# Patient Record
Sex: Female | Born: 1945
Health system: Southern US, Community
[De-identification: ages and names within clinical notes are randomized; demographics above are authoritative.]

## PROBLEM LIST (undated history)

## (undated) DIAGNOSIS — C50919 Malignant neoplasm of unspecified site of unspecified female breast: Secondary | ICD-10-CM

## (undated) DIAGNOSIS — I10 Essential (primary) hypertension: Secondary | ICD-10-CM

## (undated) DIAGNOSIS — C801 Malignant (primary) neoplasm, unspecified: Secondary | ICD-10-CM

## (undated) HISTORY — DX: Malignant neoplasm of unspecified site of unspecified female breast: C50.919

---

## 2004-09-01 HISTORY — PX: ABDOMINAL HYSTERECTOMY: SHX81

## 2015-09-07 ENCOUNTER — Encounter (HOSPITAL_COMMUNITY): Payer: Self-pay | Admitting: Family Medicine

## 2015-09-07 ENCOUNTER — Emergency Department (HOSPITAL_COMMUNITY)
Admission: EM | Admit: 2015-09-07 | Discharge: 2015-09-07 | Disposition: A | Discharge status: Home / Self Care | Payer: Managed Care, Other (non HMO) | Attending: Emergency Medicine | Admitting: Emergency Medicine

## 2015-09-07 DIAGNOSIS — Z87891 Personal history of nicotine dependence: Secondary | ICD-10-CM | POA: Diagnosis not present

## 2015-09-07 DIAGNOSIS — C7932 Secondary malignant neoplasm of cerebral meninges: Secondary | ICD-10-CM | POA: Diagnosis not present

## 2015-09-07 DIAGNOSIS — I10 Essential (primary) hypertension: Secondary | ICD-10-CM | POA: Diagnosis not present

## 2015-09-07 DIAGNOSIS — R739 Hyperglycemia, unspecified: Secondary | ICD-10-CM | POA: Diagnosis not present

## 2015-09-07 DIAGNOSIS — C50919 Malignant neoplasm of unspecified site of unspecified female breast: Secondary | ICD-10-CM | POA: Diagnosis not present

## 2015-09-07 DIAGNOSIS — Z7952 Long term (current) use of systemic steroids: Secondary | ICD-10-CM | POA: Insufficient documentation

## 2015-09-07 DIAGNOSIS — C787 Secondary malignant neoplasm of liver and intrahepatic bile duct: Secondary | ICD-10-CM | POA: Diagnosis not present

## 2015-09-07 DIAGNOSIS — Z79899 Other long term (current) drug therapy: Secondary | ICD-10-CM | POA: Diagnosis not present

## 2015-09-07 DIAGNOSIS — H538 Other visual disturbances: Secondary | ICD-10-CM | POA: Diagnosis not present

## 2015-09-07 HISTORY — DX: Malignant (primary) neoplasm, unspecified: C80.1

## 2015-09-07 HISTORY — DX: Essential (primary) hypertension: I10

## 2015-09-07 LAB — BASIC METABOLIC PANEL
Anion gap: 13 (ref 5–15)
BUN: 27 mg/dL — ABNORMAL HIGH (ref 6–20)
CO2: 26 mmol/L (ref 22–32)
Calcium: 9.6 mg/dL (ref 8.9–10.3)
Chloride: 101 mmol/L (ref 101–111)
Creatinine, Ser: 0.81 mg/dL (ref 0.44–1.00)
GFR calc non Af Amer: >60 mL/min (ref >60)
Glucose, Bld: 292 mg/dL — ABNORMAL HIGH (ref 65–99)
Potassium: 4.6 mmol/L (ref 3.5–5.1)
SODIUM: 140 mmol/L (ref 135–145)

## 2015-09-07 LAB — CBC
HCT: 31 % — ABNORMAL LOW (ref 36.0–46.0)
HEMOGLOBIN: 9.3 g/dL — AB (ref 12.0–15.0)
MCH: 28.6 pg (ref 26.0–34.0)
MCHC: 30 g/dL (ref 30.0–36.0)
MCV: 95.4 fL (ref 78.0–100.0)
Platelets: 194 10*3/uL (ref 150–400)
RBC: 3.25 MIL/uL — AB (ref 3.87–5.11)
RDW: 21.1 % — ABNORMAL HIGH (ref 11.5–15.5)
WBC: 8.6 10*3/uL (ref 4.0–10.5)

## 2015-09-07 LAB — CBG MONITORING, ED: GLUCOSE-CAPILLARY: 235 mg/dL — AB (ref 65–99)

## 2015-09-07 NOTE — ED Notes (Signed)
Pt here for increasead blood sugar. sts her doctor took blood work on the 4th and called her due to blood sugar being over 400. sts she is currently on steroids, and chemo for breast cancer. Pt recently moved from [place] and has no doctor here.

## 2015-09-07 NOTE — Discharge Instructions (Signed)
Referral information to hematology oncology at [facility] provided.: Monday for follow-up. Continue your current medications. Try to decrease the amount of sugar in your diet. Return for any new or worse symptoms.

## 2015-09-07 NOTE — ED Provider Notes (Addendum)
CSN: [id number]     Arrival date & time 09/07/15  1638 History   First MD Initiated Contact with Patient 09/07/15 1921     Chief Complaint  Patient presents with  . Hyperglycemia     (Consider location/radiation/quality/duration/timing/severity/associated sxs/prior Treatment) Patient is a 70 y.o. female presenting with hyperglycemia. The history is provided by the patient.  Hyperglycemia Associated symptoms: no abdominal pain, no chest pain, no confusion, no dysuria, no fever, no shortness of breath and no weakness    Patient just moved into the area today. Has been followed up in [place] currently undergoing chemotherapy next round is in February. Patient known to have metastatic breast cancer involving the meninges as well as the liver. Patient had blood work done in [place] on January 4 which results came in yesterday patient was notified by her doctor that her blood sugar was over 400. She was told to find medical treatment for this. Patient on December 28 was started on dexamethasone 4 mg twice a day for worsening vision in the right eye. Symptoms in that area apparently were not new just had worsened. No real change on the dexamethasone. Patient not worse. Patient without a past history of diabetes. Patient without any other complaints. Patient was not given a direct referral to anybody in the area for hematology oncology. Past Medical History  Diagnosis Date  . Cancer ([place])   . Hypertension    History reviewed. No pertinent past surgical history. History reviewed. No pertinent family history. Social History  Substance Use Topics  . Smoking status: Former Smoker    Types: Cigarettes  . Smokeless tobacco: None  . Alcohol Use: Yes   OB History    No data available     Review of Systems  Constitutional: Negative for fever.  HENT: Negative for congestion.   Eyes: Positive for visual disturbance.  Respiratory: Negative for shortness of breath.   Cardiovascular: Negative for  chest pain.  Gastrointestinal: Negative for abdominal pain.  Genitourinary: Negative for dysuria.  Musculoskeletal: Negative for neck pain.  Skin: Negative for rash.  Neurological: Negative for weakness and headaches.  Hematological: Does not bruise/bleed easily.  Psychiatric/Behavioral: Negative for confusion.      Allergies  Review of patient's allergies indicates no known allergies.  Home Medications   Prior to Admission medications   Medication Sig Start Date End Date Taking? Authorizing Provider  amLODipine (NORVASC) 10 MG tablet Take 10 mg by mouth daily.   Yes Historical Provider, MD  benazepril (LOTENSIN) 20 MG tablet Take 20 mg by mouth daily.   Yes Historical Provider, MD  dexamethasone (DECADRON) 4 MG tablet Take 4 mg by mouth 2 (two) times daily with a meal.   Yes Historical Provider, MD  ondansetron (ZOFRAN) 4 MG tablet Take 4 mg by mouth every 8 (eight) hours as needed for nausea or vomiting.   Yes Historical Provider, MD  OVER THE COUNTER MEDICATION Take 2 capsules by mouth 2 (two) times daily. Medication: Circulation and vein support   Yes Historical Provider, MD  potassium chloride (K-DUR,KLOR-CON) 10 MEQ tablet Take 10 mEq by mouth daily.   Yes Historical Provider, MD  valsartan-hydrochlorothiazide (DIOVAN-HCT) 160-12.5 MG tablet Take 1 tablet by mouth daily.   Yes Historical Provider, MD   BP 117/76 mmHg  Pulse 80  Resp 13  SpO2 100% Physical Exam  Constitutional: She is oriented to person, place, and time. She appears well-developed and well-nourished. No distress.  HENT:  Head: Normocephalic and atraumatic.  Mouth/Throat: Oropharynx  is clear and moist.  Eyes: Conjunctivae and EOM are normal.  Decreased vision in right eye  Neck: Normal range of motion. Neck supple.  Cardiovascular: Normal rate, regular rhythm and normal heart sounds.   No murmur heard. Pulmonary/Chest: Effort normal and breath sounds normal. No respiratory distress.  Abdominal: Soft.  Bowel sounds are normal. There is no tenderness.  Neurological: She is alert and oriented to person, place, and time. A cranial nerve deficit is present. She exhibits normal muscle tone. Coordination normal.  Decreased vision in right eye  Skin: Skin is warm.  Nursing note and vitals reviewed.   ED Course  Procedures (including critical care time) Labs Review Labs Reviewed  BASIC METABOLIC PANEL - Abnormal; Notable for the following:    Glucose, Bld 292 (*)    BUN 27 (*)    All other components within normal limits  CBC - Abnormal; Notable for the following:    RBC 3.25 (*)    Hemoglobin 9.3 (*)    HCT 31.0 (*)    RDW 21.1 (*)    All other components within normal limits  CBG MONITORING, ED - Abnormal; Notable for the following:    Glucose-Capillary 235 (*)    All other components within normal limits  URINALYSIS, ROUTINE W REFLEX MICROSCOPIC (NOT AT [facility])   Results for orders placed or performed during the hospital encounter of [id number]  Basic metabolic panel  Result Value Ref Range   Sodium 140 135 - 145 mmol/L   Potassium 4.6 3.5 - 5.1 mmol/L   Chloride 101 101 - 111 mmol/L   CO2 26 22 - 32 mmol/L   Glucose, Bld 292 (H) 65 - 99 mg/dL   BUN 27 (H) 6 - 20 mg/dL   Creatinine, Ser 0.81 0.44 - 1.00 mg/dL   Calcium 9.6 8.9 - 10.3 mg/dL   GFR calc non Af Amer >60 >60 mL/min   GFR calc Af Amer >60 >60 mL/min   Anion gap 13 5 - 15  CBC  Result Value Ref Range   WBC 8.6 4.0 - 10.5 K/uL   RBC 3.25 (L) 3.87 - 5.11 MIL/uL   Hemoglobin 9.3 (L) 12.0 - 15.0 g/dL   HCT 31.0 (L) 36.0 - 46.0 %   MCV 95.4 78.0 - 100.0 fL   MCH 28.6 26.0 - 34.0 pg   MCHC 30.0 30.0 - 36.0 g/dL   RDW 21.1 (H) 11.5 - 15.5 %   Platelets 194 150 - 400 K/uL  CBG monitoring, ED  Result Value Ref Range   Glucose-Capillary 235 (H) 65 - 99 mg/dL     Imaging Review No results found. I have personally reviewed and evaluated these images and lab results as part of my medical decision-making.   EKG  Interpretation None      MDM   Final diagnoses:  Hyperglycemia  Malignant neoplasm of female breast, unspecified laterality, unspecified site of breast [facility])    Patient just moved down to this area today. Has been followed the in [place] by an oncologist for metastatic breast cancer. Patient with known metastases to the liver and as well as the meninges. December 28 was started on dexamethasone for some changes in her vision in the right eye. Patient was notified by her doctor that on the fourth blood work that was done when she was back in town had an elevated blood sugar over 400. Referred here for evaluation of that.  Patient's initial blood sugar here was 292 and repeat was 235.  No evidence of any complicating electrolyte factors from the elevated blood sugar. Patient is currently on 4 mg of dexamethasone twice a day.  Patient will be given a referral to [facility] long hematology oncology for additional follow-up and treatment. Patient in no acute distress nontoxic no significant change compared to her last visit in [place]. Blood sugar right now does not require any significant intervention. Patient will return here for any new or worse symptoms.    Fredia Sorrow, MD 09/07/15 [id number]  Fredia Sorrow, MD 09/07/15 2037

## 2015-09-11 ENCOUNTER — Other Ambulatory Visit (HOSPITAL_BASED_OUTPATIENT_CLINIC_OR_DEPARTMENT_OTHER): Payer: Managed Care, Other (non HMO)

## 2015-09-11 ENCOUNTER — Telehealth: Payer: Self-pay | Admitting: Hematology & Oncology

## 2015-09-11 ENCOUNTER — Ambulatory Visit (HOSPITAL_BASED_OUTPATIENT_CLINIC_OR_DEPARTMENT_OTHER): Payer: Managed Care, Other (non HMO) | Admitting: Family

## 2015-09-11 ENCOUNTER — Encounter: Payer: Self-pay | Admitting: Family

## 2015-09-11 ENCOUNTER — Ambulatory Visit: Payer: 59

## 2015-09-11 VITALS — BP 115/65 | HR 80 | Temp 97.4°F | Resp 18 | Wt 203.0 lb

## 2015-09-11 DIAGNOSIS — R740 Nonspecific elevation of levels of transaminase and lactic acid dehydrogenase [LDH]: Secondary | ICD-10-CM

## 2015-09-11 DIAGNOSIS — C7951 Secondary malignant neoplasm of bone: Secondary | ICD-10-CM

## 2015-09-11 DIAGNOSIS — C50919 Malignant neoplasm of unspecified site of unspecified female breast: Secondary | ICD-10-CM

## 2015-09-11 DIAGNOSIS — C7800 Secondary malignant neoplasm of unspecified lung: Secondary | ICD-10-CM

## 2015-09-11 DIAGNOSIS — T380X5A Adverse effect of glucocorticoids and synthetic analogues, initial encounter: Secondary | ICD-10-CM

## 2015-09-11 DIAGNOSIS — C50912 Malignant neoplasm of unspecified site of left female breast: Secondary | ICD-10-CM | POA: Diagnosis not present

## 2015-09-11 DIAGNOSIS — Z170 Estrogen receptor positive status [ER+]: Secondary | ICD-10-CM

## 2015-09-11 DIAGNOSIS — C787 Secondary malignant neoplasm of liver and intrahepatic bile duct: Secondary | ICD-10-CM | POA: Diagnosis not present

## 2015-09-11 DIAGNOSIS — C7889 Secondary malignant neoplasm of other digestive organs: Secondary | ICD-10-CM

## 2015-09-11 DIAGNOSIS — C7932 Secondary malignant neoplasm of cerebral meninges: Secondary | ICD-10-CM

## 2015-09-11 DIAGNOSIS — R739 Hyperglycemia, unspecified: Secondary | ICD-10-CM

## 2015-09-11 HISTORY — DX: Malignant neoplasm of unspecified site of unspecified female breast: C50.919

## 2015-09-11 LAB — CBC WITH DIFFERENTIAL (CANCER CENTER ONLY)
BASO#: 0 10*3/uL (ref 0.0–0.2)
BASO%: 0.5 % (ref 0.0–2.0)
EOS ABS: 0 10*3/uL (ref 0.0–0.5)
EOS%: 0 % (ref 0.0–7.0)
HCT: 31.7 % — ABNORMAL LOW (ref 34.8–46.6)
HEMOGLOBIN: 9.9 g/dL — AB (ref 11.6–15.9)
LYMPH#: 3.3 10*3/uL (ref 0.9–3.3)
LYMPH%: 37.2 % (ref 14.0–48.0)
MCH: 29.7 pg (ref 26.0–34.0)
MCHC: 31.2 g/dL — ABNORMAL LOW (ref 32.0–36.0)
MCV: 95 fL (ref 81–101)
MONO#: 0.7 10*3/uL (ref 0.1–0.9)
MONO%: 7.6 % (ref 0.0–13.0)
NEUT%: 54.7 % (ref 39.6–80.0)
NEUTROS ABS: 4.9 10*3/uL (ref 1.5–6.5)
Platelets: 197 10*3/uL (ref 145–400)
RBC: 3.33 10*6/uL — AB (ref 3.70–5.32)
RDW: 20.3 % — ABNORMAL HIGH (ref 11.1–15.7)

## 2015-09-11 LAB — COMPREHENSIVE METABOLIC PANEL
ALT: 75 U/L — AB (ref 0–55)
AST: 37 U/L — AB (ref 5–34)
Albumin: 3.7 g/dL (ref 3.5–5.0)
Alkaline Phosphatase: 96 U/L (ref 40–150)
Anion Gap: 15 mEq/L — ABNORMAL HIGH (ref 3–11)
BILIRUBIN TOTAL: 0.72 mg/dL (ref 0.20–1.20)
BUN: 33 mg/dL — ABNORMAL HIGH (ref 7.0–26.0)
CHLORIDE: 98 meq/L (ref 98–109)
CO2: 20 meq/L — AB (ref 22–29)
CREATININE: 1 mg/dL (ref 0.6–1.1)
Calcium: 9.2 mg/dL (ref 8.4–10.4)
EGFR: 72 mL/min/{1.73_m2} — ABNORMAL LOW (ref >90)
Glucose: 331 mg/dl — ABNORMAL HIGH (ref 70–140)
Potassium: 4.4 mEq/L (ref 3.5–5.1)
SODIUM: 134 meq/L — AB (ref 136–145)
TOTAL PROTEIN: 6.8 g/dL (ref 6.4–8.3)

## 2015-09-11 LAB — LACTATE DEHYDROGENASE: LDH: 630 U/L — AB (ref 125–245)

## 2015-09-11 LAB — TECHNOLOGIST REVIEW CHCC SATELLITE: Tech Review: 8

## 2015-09-11 MED ORDER — METFORMIN HCL 1000 MG PO TABS: 1000.0000 mg | ORAL_TABLET | Freq: Every day | ORAL | Status: AC

## 2015-09-11 MED FILL — metFORMIN HCL 1000 MG TABS: 1000 | 30 days supply | Qty: 30 | Fill #0

## 2015-09-11 NOTE — Telephone Encounter (Addendum)
New patient in the office today that saw Judson Roch our NP. Judson Roch told her that she may be able to get help with applying for Medicare w a SW. She moved down for [facility] and her [employer] is going to term on 10/02/2015 and she needs to apply for some type of assistance. I gave her information for our financial information , as well as, the SW's your bus cards.   Her [employer] insurance term'd 09/04/2015. Pt is aware and is applying for MEDICARE for her oncology chemo visits at this time.   As of date, pt is SELF-PAY

## 2015-09-11 NOTE — Progress Notes (Signed)
Hematology/Oncology Consultation   Name: Kristen Ruiz      MRN: [id number]    Location: Room/bed info not found  Date: 09/11/2015 Time:8:54 AM   REFERRING PHYSICIAN: Fredia Sorrow, MD  REASON FOR CONSULT: Metastatic breast cancer   DIAGNOSIS: Metastatic breast cancer   HISTORY OF PRESENT ILLNESS: [name] is a very pleasant 70 yo African American female with metastatic breast cancer. She was initially diagnosed in December 2013 with stage 4 multifocal DCIS in 2 lesions of the upper quadrants of the left breast and also invasive carcinoma in one 10 mm lesion. This was found to be ER/PR/HER-2 +. She also had a bone scan at that time which was positive for lesions.  Unfortunately she was under a lot of stress at the time as her mother had just passed away. She refused radiation therapy and mastectomy. She did agree to chemotherapy and received Herceptin therapy. She was also on Fermara for around 2 years before progressing in November 2015 with bilateral pulmonary nodules, multiple liver metastasis and multiple bone metastasis. She was then switched to Xeloda and tolerated this well.  Her CA 27.29 throughout this process has been elevated. We did check her level today and results are pending.  In October 2016 she woke up with numbness in the right side of her face and blurred vision of the right eye. She went to the ED thinking she was having a stroke. MRI of the brain revealed diffuse leptomeningeal thickening/enhancement highly suspicious for leptomeningeal carcinomatosis. CT of abdomen and pelvis at that time showed extensive bony mets,interstitial nodularity suspicious for spread of malignancy in lung bases and multiple new hypodensities in the liver and spleen.   She was started on Decadron 4 mg BID and has completed 2 cycles of Cytoxan and Taxotere without any problems. Her last treatment was in [place] on January 4th.  She retired from her job on January 3rd and moved here to [facility] on the 5th  to be with her sister and boyfriend. She went to the ED at [facility] on January 6th for an elevated BS of 482 undoubtedly due to the steroid. Her blood sugar today is 331 so we will start her on Metformin 1,000 mg daily to help manage this.   She is feeling fatigued and weak at this time. She has some mild SOB with exertion.  She has had a couple episodes of dizziness where she had to lay down and rest until it passed. She has had no syncopal episodes or seizures.  She has occasional "cluster headaches" on the left side of her head which resolves with taking the Decadron. She still has blurred vision of the right eye and states that "it's like there is a screen in front of her eye."  No fever, chills, n/v, cough, rash, chest pain, palpitations, abdominal pain or changes in bowel or bladder habits. She eats Activia every morning and drinks smooth move tea in the evenings to prevent constipation.  She states that she has a very high pain tolerance and has not noticed and aches or pains. No swelling, tenderness numbness or tingling in her extremities.  She has maintained a good appetite and has stayed well hydrated. She denies any significant weight loss or gain.  She is a former smoker and has an occasional alcoholic beverage socially.   ROS: All other 10 point review of systems is negative.   PAST MEDICAL HISTORY:   Past Medical History  Diagnosis Date  . Cancer ([place])   .  Hypertension   . Breast cancer metastasized to multiple sites [facility]) 09/11/2015    ALLERGIES: No Known Allergies    MEDICATIONS:  Current Outpatient Prescriptions on File Prior to Visit  Medication Sig Dispense Refill  . amLODipine (NORVASC) 10 MG tablet Take 10 mg by mouth daily.    . benazepril (LOTENSIN) 20 MG tablet Take 20 mg by mouth daily.    Marland Kitchen dexamethasone (DECADRON) 4 MG tablet Take 4 mg by mouth 2 (two) times daily with a meal.    . ondansetron (ZOFRAN) 4 MG tablet Take 4 mg by mouth every 8 (eight) hours as needed  for nausea or vomiting.    Marland Kitchen OVER THE COUNTER MEDICATION Take 2 capsules by mouth 2 (two) times daily. Medication: Circulation and vein support    . potassium chloride (K-DUR,KLOR-CON) 10 MEQ tablet Take 10 mEq by mouth daily.    . valsartan-hydrochlorothiazide (DIOVAN-HCT) 160-12.5 MG tablet Take 1 tablet by mouth daily.     No current facility-administered medications on file prior to visit.     PAST SURGICAL HISTORY No past surgical history on file.  FAMILY HISTORY: No family history on file.  SOCIAL HISTORY:  reports that she has quit smoking. Her smoking use included Cigarettes. She does not have any smokeless tobacco history on file. She reports that she drinks alcohol. Her drug history is not on file.  PERFORMANCE STATUS: The patient's performance status is 1 - Symptomatic but completely ambulatory  PHYSICAL EXAM: Most Recent Vital Signs: There were no vitals taken for this visit. BP 115/65 mmHg  Pulse 80  Temp(Src) 97.4 F (36.3 C) (Oral)  Resp 18  Wt 203 lb (92.08 kg)  General Appearance:    Alert, cooperative, no distress, appears stated age  Head:    Normocephalic, without obvious abnormality, atraumatic  Eyes:    PERRL, conjunctiva/corneas clear, EOM's intact, fundi    benign, both eyes        Throat:   Lips, mucosa, and tongue normal; teeth and gums normal  Neck:   Supple, symmetrical, trachea midline, no adenopathy;    thyroid:  no enlargement/tenderness/nodules; no carotid   bruit or JVD  Back:     Symmetric, no curvature, ROM normal, no CVA tenderness  Lungs:     Clear to auscultation bilaterally, respirations unlabored  Chest Wall:    No tenderness or deformity, she has a mid sternal raised area that she states is due to metastatic disease     Heart:    Regular rate and rhythm, S1 and S2 normal, no murmur, rub   or gallop  Breast Exam:    No tenderness, masses, or nipple abnormality  Abdomen:     Soft, non-tender, bowel sounds active all four quadrants,     no masses, no organomegaly        Extremities:   Extremities normal, atraumatic, no cyanosis or edema  Pulses:   2+ and symmetric all extremities  Skin:   Skin color, texture, turgor normal, no rashes or lesions  Lymph nodes:   Cervical, supraclavicular, and axillary nodes normal  Neurologic:   CNII-XII intact, normal strength, sensation and reflexes    throughout    LABORATORY DATA:  No results found for this or any previous visit (from the past 48 hour(s)).    RADIOGRAPHY: No results found.     PATHOLOGY: None   ASSESSMENT/PLAN: Ms. Decoursey is a very pleasant 70 yo African American female with metastatic breast cancer that includes mets to the bone,  lungs, liver, spleen and meninges diagnosed in December of 2013. She refused radiation and mastectomy at that time. She progressed while on Fermara and then again while  on Xgeva. She has no completed 2 cycles of Cytoxan and Taxotere.  She moved here from [facility] Friday and is just getting settled in.  We will help her get established with a PCP downstairs at [facility].  We will plan to start cycle 3 of chemo on Thursday.  She will have a follow-up with labs and next cycle of treatment in 3 week. Prescription for Metformin sent to pharmacy downstairs for her to pick up on her way out.  We will see what her Ca 27.29 today shows and then determine to to schedule repeat scans.  All questions were answered. She knows to contact us with any problems, questions or concerns. We can certainly see her much sooner if necessary.  She was discussed with and also seen by Dr. Marin Olp and he is in agreement with the aforementioned.   [facility]    Addendum:  I saw and examined the patient with Zaila Crew. Ms. Aube recently moved down to [facility]. She is originally from [place] area. She has disease that is HER-2 negative. This was done with a biopsy.  She had MRI of the brain which showed some meningeal thickening. She has hepatic,  pulmonary, splenic and bone metastasis.  He did do a CA 27.29 on her. This showed a CA 27.29 of 1135. Her LDH is also elevated. Her prealbumin is still pretty good.  We will continue her on the regimen that she was on. She was on Cytoxan/Taxotere. She was last treated on January 4.  We will go ahead and get her set up for treatment. She understands about all the side effects since she will have the same treatment that she had open [place].  Her blood sugars have been quite high. Hopefully, get on metformin will help. She may need to go to an endocrinologist to try to get the blood sugars under better control.  She is very nice. I will like to hope that she will do well.  Hopefully we will not have to do a biopsy. We may consider a liquid biopsy to see about any change in her receptor status.  We spent about 45 minutes with her.  Pete E.

## 2015-09-12 LAB — CANCER ANTIGEN 27.29: CA 27.29: 1135.7 U/mL — ABNORMAL HIGH (ref 0.0–38.6)

## 2015-09-12 LAB — PREALBUMIN: PREALBUMIN: 41 mg/dL — AB (ref 10–36)

## 2015-09-14 ENCOUNTER — Encounter: Payer: Self-pay | Admitting: *Deleted

## 2015-09-14 MED FILL — DEXAMETHASONE 4 MG TABLET: 4 | 30 days supply | Qty: 60 | Fill #0

## 2015-09-14 NOTE — Progress Notes (Signed)
[place] Work  Clinical Social Work was referred by Futures trader for assessment of psychosocial needs due to insurance concerns.  Clinical Social Worker contacted patient at home to offer support and assess for needs. Pt concerned as she recently moved her from [place] and her employer insurance is ending. CSW suggested pt reach out to her HR dept as she should be able to get COBRA until her medicare starts. Pt had not thought of this, but agrees and states she can afford to pay for COBRA as well. Pt has begun the process for [facility] enrollment and feels this process has begun. Pt reports to have support from her grandson locally and CSW reviewed additional support options through the [facility] as well. Pt interested in being added to the mailing list and agrees to reach out to [place] team as needed.    Clinical Social Work interventions: Supportive Psychiatric nurse education and assistance  Loren Racer, [place] Worker [place]  [place] Phone: [phone] Fax: [phone]

## 2015-09-22 ENCOUNTER — Emergency Department (HOSPITAL_COMMUNITY): Payer: Medicare Other

## 2015-09-22 ENCOUNTER — Encounter (HOSPITAL_COMMUNITY): Payer: Self-pay | Admitting: *Deleted

## 2015-09-22 ENCOUNTER — Inpatient Hospital Stay (HOSPITAL_COMMUNITY)
Admission: EM | Admit: 2015-09-22 | Discharge: 2015-09-24 | DRG: 391 | Disposition: A | Discharge status: Home Health | Payer: Medicare Other | Attending: Internal Medicine | Admitting: Internal Medicine

## 2015-09-22 DIAGNOSIS — C7889 Secondary malignant neoplasm of other digestive organs: Secondary | ICD-10-CM | POA: Diagnosis not present

## 2015-09-22 DIAGNOSIS — C787 Secondary malignant neoplasm of liver and intrahepatic bile duct: Secondary | ICD-10-CM | POA: Diagnosis not present

## 2015-09-22 DIAGNOSIS — H5461 Unqualified visual loss, right eye, normal vision left eye: Secondary | ICD-10-CM | POA: Diagnosis present

## 2015-09-22 DIAGNOSIS — R109 Unspecified abdominal pain: Secondary | ICD-10-CM

## 2015-09-22 DIAGNOSIS — E43 Unspecified severe protein-calorie malnutrition: Secondary | ICD-10-CM | POA: Insufficient documentation

## 2015-09-22 DIAGNOSIS — C7800 Secondary malignant neoplasm of unspecified lung: Secondary | ICD-10-CM | POA: Diagnosis present

## 2015-09-22 DIAGNOSIS — C50919 Malignant neoplasm of unspecified site of unspecified female breast: Secondary | ICD-10-CM | POA: Diagnosis not present

## 2015-09-22 DIAGNOSIS — K5900 Constipation, unspecified: Secondary | ICD-10-CM | POA: Diagnosis not present

## 2015-09-22 DIAGNOSIS — C7951 Secondary malignant neoplasm of bone: Secondary | ICD-10-CM

## 2015-09-22 DIAGNOSIS — Z6830 Body mass index (BMI) 30.0-30.9, adult: Secondary | ICD-10-CM

## 2015-09-22 DIAGNOSIS — I10 Essential (primary) hypertension: Secondary | ICD-10-CM

## 2015-09-22 DIAGNOSIS — C7949 Secondary malignant neoplasm of other parts of nervous system: Secondary | ICD-10-CM | POA: Diagnosis present

## 2015-09-22 DIAGNOSIS — Z7984 Long term (current) use of oral hypoglycemic drugs: Secondary | ICD-10-CM

## 2015-09-22 DIAGNOSIS — Z87891 Personal history of nicotine dependence: Secondary | ICD-10-CM

## 2015-09-22 DIAGNOSIS — E872 Acidosis: Secondary | ICD-10-CM | POA: Diagnosis not present

## 2015-09-22 DIAGNOSIS — Z170 Estrogen receptor positive status [ER+]: Secondary | ICD-10-CM

## 2015-09-22 DIAGNOSIS — R630 Anorexia: Secondary | ICD-10-CM | POA: Diagnosis not present

## 2015-09-22 DIAGNOSIS — Z7952 Long term (current) use of systemic steroids: Secondary | ICD-10-CM

## 2015-09-22 DIAGNOSIS — E860 Dehydration: Secondary | ICD-10-CM

## 2015-09-22 DIAGNOSIS — I959 Hypotension, unspecified: Secondary | ICD-10-CM | POA: Diagnosis present

## 2015-09-22 DIAGNOSIS — Z66 Do not resuscitate: Secondary | ICD-10-CM | POA: Diagnosis present

## 2015-09-22 DIAGNOSIS — C799 Secondary malignant neoplasm of unspecified site: Secondary | ICD-10-CM

## 2015-09-22 LAB — COMPREHENSIVE METABOLIC PANEL
ALK PHOS: 84 U/L (ref 38–126)
ALT: 59 U/L — AB (ref 14–54)
AST: 57 U/L — ABNORMAL HIGH (ref 15–41)
Albumin: 3.3 g/dL — ABNORMAL LOW (ref 3.5–5.0)
Anion gap: 14 (ref 5–15)
BUN: 28 mg/dL — AB (ref 6–20)
CALCIUM: 8.7 mg/dL — AB (ref 8.9–10.3)
CO2: 17 mmol/L — AB (ref 22–32)
CREATININE: 0.86 mg/dL (ref 0.44–1.00)
Chloride: 102 mmol/L (ref 101–111)
GFR calc non Af Amer: >60 mL/min (ref >60)
GLUCOSE: 273 mg/dL — AB (ref 65–99)
Potassium: 5 mmol/L (ref 3.5–5.1)
SODIUM: 133 mmol/L — AB (ref 135–145)
Total Bilirubin: 1.2 mg/dL (ref 0.3–1.2)
Total Protein: 6 g/dL — ABNORMAL LOW (ref 6.5–8.1)

## 2015-09-22 LAB — I-STAT TROPONIN, ED: TROPONIN I, POC: 0.03 ng/mL (ref 0.00–0.08)

## 2015-09-22 LAB — CBC
HEMATOCRIT: 34.6 % — AB (ref 36.0–46.0)
Hemoglobin: 11.1 g/dL — ABNORMAL LOW (ref 12.0–15.0)
MCH: 29.6 pg (ref 26.0–34.0)
MCHC: 32.1 g/dL (ref 30.0–36.0)
MCV: 92.3 fL (ref 78.0–100.0)
PLATELETS: 224 10*3/uL (ref 150–400)
RBC: 3.75 MIL/uL — AB (ref 3.87–5.11)
RDW: 19 % — AB (ref 11.5–15.5)
WBC: 7 10*3/uL (ref 4.0–10.5)

## 2015-09-22 LAB — URINALYSIS, ROUTINE W REFLEX MICROSCOPIC
Glucose, UA: 100 mg/dL — AB
HGB URINE DIPSTICK: NEGATIVE
Ketones, ur: 15 mg/dL — AB
Leukocytes, UA: NEGATIVE
Nitrite: NEGATIVE
PH: 5 (ref 5.0–8.0)
Protein, ur: NEGATIVE mg/dL
SPECIFIC GRAVITY, URINE: 1.023 (ref 1.005–1.030)

## 2015-09-22 LAB — LIPASE, BLOOD: Lipase: 75 U/L — ABNORMAL HIGH (ref 11–51)

## 2015-09-22 MED ORDER — ACETAMINOPHEN 325 MG PO TABS: 650.0000 mg | ORAL_TABLET | Freq: Four times a day (QID) | ORAL | Status: DC | PRN

## 2015-09-22 MED ORDER — SODIUM CHLORIDE 0.9 % IV BOLUS (SEPSIS)
1000.0000 mL | Freq: Once | INTRAVENOUS | Status: AC
  Administered 2015-09-22: 1000 mL via INTRAVENOUS

## 2015-09-22 MED ORDER — HYDROMORPHONE HCL 1 MG/ML IJ SOLN
1.0000 mg | Freq: Once | INTRAMUSCULAR | Status: AC
  Administered 2015-09-22: 1 mg via INTRAVENOUS
  Filled 2015-09-22: qty 1

## 2015-09-22 MED ORDER — ASPIRIN EC 81 MG PO TBEC: 81.0000 mg | DELAYED_RELEASE_TABLET | Freq: Every day | ORAL | Status: DC

## 2015-09-22 MED ORDER — KETOROLAC TROMETHAMINE 15 MG/ML IJ SOLN: 15.0000 mg | Freq: Four times a day (QID) | INTRAMUSCULAR | Status: DC | PRN

## 2015-09-22 MED ORDER — MAGNESIUM CITRATE PO SOLN: 1.0000 | Freq: Once | ORAL | Status: DC | PRN

## 2015-09-22 MED ORDER — KETOROLAC TROMETHAMINE 15 MG/ML IJ SOLN
15.0000 mg | Freq: Four times a day (QID) | INTRAMUSCULAR | Status: DC | PRN
  Administered 2015-09-23 – 2015-09-24 (×4): 15 mg via INTRAVENOUS
  Filled 2015-09-22 (×4): qty 1

## 2015-09-22 MED ORDER — ONDANSETRON HCL 4 MG PO TABS: 4.0000 mg | ORAL_TABLET | Freq: Four times a day (QID) | ORAL | Status: DC | PRN

## 2015-09-22 MED ORDER — HYDROMORPHONE HCL 1 MG/ML IJ SOLN
0.5000 mg | Freq: Once | INTRAMUSCULAR | Status: AC
  Administered 2015-09-22: 0.5 mg via INTRAVENOUS
  Filled 2015-09-22: qty 1

## 2015-09-22 MED ORDER — ENOXAPARIN SODIUM 40 MG/0.4ML ~~LOC~~ SOLN
40.0000 mg | SUBCUTANEOUS | Status: DC
  Administered 2015-09-23 (×2): 40 mg via SUBCUTANEOUS
  Filled 2015-09-22 (×3): qty 0.4

## 2015-09-22 MED ORDER — SODIUM CHLORIDE 0.9 % IV SOLN
INTRAVENOUS | Status: DC
  Administered 2015-09-23 (×2): via INTRAVENOUS

## 2015-09-22 MED ORDER — ONDANSETRON HCL 4 MG/2ML IJ SOLN
4.0000 mg | Freq: Four times a day (QID) | INTRAMUSCULAR | Status: DC | PRN
  Administered 2015-09-23 – 2015-09-24 (×2): 4 mg via INTRAVENOUS
  Filled 2015-09-22 (×2): qty 2

## 2015-09-22 MED ORDER — ONDANSETRON HCL 4 MG/2ML IJ SOLN
4.0000 mg | Freq: Once | INTRAMUSCULAR | Status: AC
  Administered 2015-09-22: 4 mg via INTRAVENOUS
  Filled 2015-09-22: qty 2

## 2015-09-22 MED ORDER — SENNOSIDES-DOCUSATE SODIUM 8.6-50 MG PO TABS
2.0000 | ORAL_TABLET | Freq: Once | ORAL | Status: AC
  Administered 2015-09-23: 2 via ORAL
  Filled 2015-09-22 (×2): qty 2

## 2015-09-22 MED ORDER — DEXAMETHASONE SODIUM PHOSPHATE 4 MG/ML IJ SOLN
4.0000 mg | Freq: Two times a day (BID) | INTRAMUSCULAR | Status: DC
  Administered 2015-09-23 – 2015-09-24 (×4): 4 mg via INTRAVENOUS
  Filled 2015-09-22 (×6): qty 1

## 2015-09-22 NOTE — ED Notes (Signed)
Patient transported to X-ray 

## 2015-09-22 NOTE — H&P (Signed)
Date: 09/23/2015               Patient Name:  Kristen Ruiz MRN: [id number]  DOB: Mar 31, 1946 Age / Sex: 70 y.o., female   PCP: No Pcp Per Patient         Medical Service: Internal Medicine Teaching Service         Attending Physician: Dr. Sid Falcon, MD    First Contact: Dr. Marijean Bravo Pager: [phone]  Second Contact: Dr. Hulen Luster Pager: [phone]       After Hours (After 5p/  First Contact Pager: [phone]  weekends / holidays): Second Contact Pager: [phone]   Chief Complaint: abdominal pain, decreased PO intake  History of Present Illness: [name] is a 70 yo female with stage IV ER/PR/HER2+ breast cancer to meninges, lung, liver, spleen, and bone and HTN, presenting with a 5-6 day h/o diffuse abdominal pain.  She reports a "gassy" abdominal pain starting on the left side, now more diffuse throughout the abdomen.  Pain is worse with sitting straight up and relieved with lyign flat. She is continuing to pass gas, which helps with the abdominal pain. She is constipated at baseline, but tries to have a BM daily.  She has not had a BM in the last 5-6 days.  She does not take any medications for constipation.  She endorses mild, intermittent nausea and vomiting.  She has also had decreased PO intake for the last 5 days 2/2 to the abdominal pain.  She has been unable to take her HTN medications, but did manage to take her Decadron this morning.   She also complains of DOE for the last few weeks that is unchanged.  Her breathing improves with lying flat.  She denies orthopnea, PND, or weight gain.  She may have intermittent leg swelling. She has had bilateral calf pain for the last year, described as "cramping."  She has been taking homeopathic medication for the calf cramping.  She denies worsening calf pain or erythema.    Patient was diagnosed with metastatic breast cancer in 2013.  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  She has also metastases to lung,  bone, liver, and spleen.  She is on Decadron 4 mg BID for the meningeal involvement.  She is establishing care with [facility].  She recently moved from [place], where she was previously being treated.  She has not restarted chemotherapy, but plans to start Cytoxan and Adriamycin.  Of note, the MD attestation to her cancer center visit states she is HER2- and plans to start Cytoxan and Docutaxel.  In the ED, her BP was 80-90/40-50.  She had a positive AG metabolic acidosis with U/A positive for ketones.  She was given 2 L NS with appropriate response in BP.  Decubitus AXR was negative for free air. Supine AXR demonstrated normal bowel gas pattern. CXR was negative for effusion, but showed sclerosis of spine and humerus.  Patient denies bone pain.  Meds: Current Facility-Administered Medications  Medication Dose Route Frequency Provider Last Rate Last Dose  . 0.9 %  sodium chloride infusion   Intravenous Continuous Rushil Sherrye Payor, MD 125 mL/hr at 09/23/15 0000    . acetaminophen (TYLENOL) tablet 650 mg  650 mg Oral Q6H PRN Rushil Patel V, MD      . dexamethasone (DECADRON) injection 4 mg  4 mg Intravenous Q12H Rushil Patel V, MD      . enoxaparin (LOVENOX) injection 40  mg  40 mg Subcutaneous Q24H Rushil Patel V, MD      . ketorolac (TORADOL) 15 MG/ML injection 15 mg  15 mg Intravenous Q6H PRN Rushil Patel V, MD      . ondansetron (ZOFRAN) tablet 4 mg  4 mg Oral Q6H PRN Rushil Sherrye Payor, MD       Or  . ondansetron (ZOFRAN) injection 4 mg  4 mg Intravenous Q6H PRN Rushil Sherrye Payor, MD      . senna-docusate (Senokot-S) tablet 2 tablet  2 tablet Oral Once Rushil Sherrye Payor, MD        Allergies: Allergies as of 09/22/2015  . (No Known Allergies)   Past Medical History  Diagnosis Date  . Cancer ([place])   . Hypertension   . Breast cancer metastasized to multiple sites [facility]) 09/11/2015   Past Surgical History  Procedure Laterality Date  . Abdominal hysterectomy  2006    Fibroids    History reviewed. No pertinent family history. Social History   Social History  . Marital Status: Single    Spouse Name: N/A  . Number of Children: N/A  . Years of Education: N/A   Occupational History  . Not on file.   Social History Main Topics  . Smoking status: Former Smoker    Types: Cigarettes  . Smokeless tobacco: Not on file  . Alcohol Use: 0.0 oz/week    0 Standard drinks or equivalent per week  . Drug Use: Not on file  . Sexual Activity: Not on file   Other Topics Concern  . Not on file   Social History Narrative    Review of Systems: Pertinent items noted in HPI and remainder of comprehensive ROS otherwise negative.  Physical Exam: Blood pressure 102/58, pulse 62, temperature 97.9 F (36.6 C), resp. rate 22, height 5' 5"  (1.651 m), weight 184 lb (83.462 kg), SpO2 97 %. Physical Exam  Constitutional: She is oriented to person, place, and time and well-developed, well-nourished, and in no distress. No distress.  Uncomfortable appearing with eyes closed, but pleasant and conversing appropriately.  HENT:  Head: Normocephalic and atraumatic.  Eyes: EOM are normal.  Neck: No JVD present. No tracheal deviation present.  Cardiovascular: Normal rate, regular rhythm, normal heart sounds and intact distal pulses.   Pulmonary/Chest: Effort normal and breath sounds normal. No stridor. No respiratory distress. She has no wheezes.  Good air movement throughout.  Abdominal: Soft. She exhibits no distension. There is no tenderness. There is no rebound and no guarding.  Bowel sounds normal to mildly hyperactive.  Nontender to palpation.   Genitourinary:  Breast exam symmetric in appearance. No discharge or asymmetric masses appreciated.  Musculoskeletal: She exhibits no edema.  Mild calf tenderness bilaterally, L>R.  No edema or erythema.  Neurological: She is alert and oriented to person, place, and time.  Skin: Skin is warm and dry. No rash noted. She is not  diaphoretic.     Lab results: Basic Metabolic Panel:  Recent Labs  09/22/15 1518  NA 133*  K 5.0  CL 102  CO2 17*  GLUCOSE 273*  BUN 28*  CREATININE 0.86  CALCIUM 8.7*   Liver Function Tests:  Recent Labs  09/22/15 1518  AST 57*  ALT 59*  ALKPHOS 84  BILITOT 1.2  PROT 6.0*  ALBUMIN 3.3*    Recent Labs  09/22/15 1518  LIPASE 75*   No results for input(s): AMMONIA in the last 72 hours. CBC:  Recent Labs  09/22/15 1518  WBC 7.0  HGB 11.1*  HCT 34.6*  MCV 92.3  PLT 224   Cardiac Enzymes: No results for input(s): CKTOTAL, CKMB, CKMBINDEX, TROPONINI in the last 72 hours. BNP: No results for input(s): PROBNP in the last 72 hours. D-Dimer: No results for input(s): DDIMER in the last 72 hours. CBG: No results for input(s): GLUCAP in the last 72 hours. Hemoglobin A1C: No results for input(s): HGBA1C in the last 72 hours. Fasting Lipid Panel: No results for input(s): CHOL, HDL, LDLCALC, TRIG, CHOLHDL, LDLDIRECT in the last 72 hours. Thyroid Function Tests: No results for input(s): TSH, T4TOTAL, FREET4, T3FREE, THYROIDAB in the last 72 hours. Anemia Panel: No results for input(s): VITAMINB12, FOLATE, FERRITIN, TIBC, IRON, RETICCTPCT in the last 72 hours. Coagulation: No results for input(s): LABPROT, INR in the last 72 hours. Urine Drug Screen: Drugs of Abuse  No results found for: LABOPIA, COCAINSCRNUR, LABBENZ, AMPHETMU, THCU, LABBARB  Alcohol Level: No results for input(s): ETH in the last 72 hours. Urinalysis:  Recent Labs  09/22/15 [place] 1.023  PHURINE 5.0  GLUCOSEU 100*  HGBUR NEGATIVE  BILIRUBINUR SMALL*  KETONESUR 15*  PROTEINUR NEGATIVE  NITRITE NEGATIVE  LEUKOCYTESUR NEGATIVE   Misc. Labs:   Imaging results:  Dg Chest 2 View  09/22/2015  CLINICAL DATA:  Shortness of Breath EXAM: CHEST - 2 VIEW COMPARISON:  None. FINDINGS: Cardiac shadows within normal limits. Mild interstitial changes are noted  bilaterally. No focal infiltrate or sizable effusion is seen. Mottled sclerosis is noted over the bony structures of the spine as well as in the proximal left humerus which may be related to bony metastatic disease. Clinical correlation is recommended. IMPRESSION: Mild interstitial changes without focal infiltrate. Changes suggestive of bony metastatic disease. Clinical correlation with the given history is recommended. Electronically Signed   By: Inez Catalina M.D.   On: 09/22/2015 16:35   Dg Abd 2 Views  09/22/2015  CLINICAL DATA:  Abdominal pain, nausea, vomiting EXAM: ABDOMEN - 2 VIEW COMPARISON:  None. FINDINGS: The bowel gas pattern is normal. There is no evidence of free air. No radio-opaque calculi or other significant radiographic abnormality is seen. IMPRESSION: Negative. Electronically Signed   By: Kathreen Devoid   On: 09/22/2015 17:12    Other results: EKG: there are no previous tracings available for comparison, sinus tachycardia.  Assessment & Plan by Problem: Active Problems:   Breast cancer metastasized to multiple sites [facility])   Constipation   Essential hypertension  Ms. Torry is a 70 yo female with stage IV ER/PR/HER2+ breast cancer to meninges, lung, liver, spleen, and bone and HTN, presenting with a 5-6 day h/o diffuse abdominal pain.   Constipation with AG+ Metabolic Acidosis: Patient presents with diffuse "gassy" abdominal pain relieved with flatus.  She has not had a BM in 5 days.  She has also had decreased PO intake leading to positive gap metabolic acidosis and ketnouria.  AXR negative for signs of obstruction and patient currently passing gas.  Symptoms most consistent with bloating/constipation.  However, metastatic disease to bowel is certainly possible given the extent of her disease.  While CT abdomen can be considered, a trial of bowel regimen is reasonable.  Upright AXR may also be considered if there is further concern for obstruction after trial of bowel regimen. -  NS 125 mL/hr - Zofran 4 mg IV q6h PRN - Senna 2 tables - NPO  Stage IV ER/PR/HER+ Breast Cancer: Patient has mets to meninges (with vision loss  of right eye), bone (humerus and spine), lung, liver, and spleen.  She has established care with [facility] and plans to start treatment with Cytoxan and Adriamycin. It is not formally stated that this is palliative chemotherapy.   - NS 125 mL/hr - Toradol 15 mg q6h  - Tylenol 650 mg q6h PRN - Decadron 4 mg IV BID  HTN: Patient hypotensive on presentation, but appropriately responded to IVF.  Med rec shows patient on Amlodipine, Benazapril, and Valsartan-HCTZ.  Unclear why she is on both ACEi and ARB. Holding antihypertensives for now. Recommend cessation of Benazapril on discharge. - HOLD Amlodipine, Benazapril, Diovan  FEN/GI: - NPO - NS @ 125 mL/hr  DVT Ppx: Lovenox   Dispo: Disposition is deferred at this time, awaiting improvement of current medical problems. Anticipated discharge in approximately 1-2 day(s).   The patient does not have a current PCP (No Pcp Per Patient) and does need an [facility] hospital follow-up appointment after discharge.  The patient does not have transportation limitations that hinder transportation to clinic appointments.  Signed: Iline Oven, MD, PhD 09/23/2015, 12:10 AM

## 2015-09-22 NOTE — ED Notes (Signed)
Pt reports feeling "like she is going to die." pt having left side abd pain and sob x 4 days. Appears very anxious and uncomfortable at triage.

## 2015-09-22 NOTE — ED Provider Notes (Signed)
CSN: [id number]     Arrival date & time 09/22/15  1455 History   First MD Initiated Contact with Patient 09/22/15 1533     Chief Complaint  Patient presents with  . Shortness of Breath  . Abdominal Pain     (Consider location/radiation/quality/duration/timing/severity/associated sxs/prior Treatment) HPI Comments: Kristen Ruiz is a 70 y.o. F with known metastatic breast cancer involving meninges, lungs, spleen, bone, and liver currently on chemotherapy presenting to ED for evaluation of SOB and abd pain.  Patient reports generalized worsening of overall health for last 3-4 weeks.  She completely lost vision in R eye in last month.  She recently moved from [place] to area and is establishing care with [facility]  Patient reports stable SOB, worse with activity, over last 3 weeks.  She denies any chest pain, orthopnea, PND.  She also has had calf pain and LE swelling that is new for her.  She also reports LUQ "gassy" and achy pain x4 days.  This is worse with movement, sitting up, and eating.  Patient gags anytime she tries to eat.  She has been unable to tolerate any food and limited liquids over last 4 days.  She is now feeling very weak related to lack of eating.  Reports infrequent BMs, but this is an ongoing problem for her.  Denies any diarrhea, vomiting, dysuria, urinary frequency/urgency.  Patient is a 70 y.o. female presenting with shortness of breath and abdominal pain. The history is provided by the patient.  Shortness of Breath Severity:  Moderate Onset quality:  Unable to specify Duration:  2 weeks Timing:  Constant Progression:  Unchanged Chronicity:  New Context: activity   Relieved by:  None tried Worsened by:  Activity and exertion Ineffective treatments:  None tried Associated symptoms: abdominal pain   Associated symptoms: no chest pain, no claudication, no cough, no diaphoresis, no fever, no hemoptysis, no PND and no sputum production   Abdominal pain:   Location:  LUQ   Quality:  Aching ("gassy")   Severity:  Moderate   Onset quality:  Gradual   Duration:  4 days   Timing:  Constant   Progression:  Unchanged   Chronicity:  New Risk factors: hx of cancer   Abdominal Pain Pain radiates to:  Does not radiate Relieved by:  Lying down and not moving Worsened by:  Eating and movement Associated symptoms: shortness of breath   Associated symptoms: no chest pain, no cough and no fever     Past Medical History  Diagnosis Date  . Cancer ([place])   . Hypertension   . Breast cancer metastasized to multiple sites [facility]) 09/11/2015   History reviewed. No pertinent past surgical history. History reviewed. No pertinent family history. Social History  Substance Use Topics  . Smoking status: Former Smoker    Types: Cigarettes  . Smokeless tobacco: None  . Alcohol Use: 0.0 oz/week    0 Standard drinks or equivalent per week   OB History    No data available     Review of Systems  Constitutional: Negative for fever and diaphoresis.  Respiratory: Positive for shortness of breath. Negative for cough, hemoptysis and sputum production.   Cardiovascular: Negative for chest pain, claudication and PND.  Gastrointestinal: Positive for abdominal pain.  All other systems reviewed and are negative.     Allergies  Review of patient's allergies indicates no known allergies.  Home Medications   Prior to Admission medications   Medication Sig Start Date End Date  Taking? Authorizing Provider  amLODipine (NORVASC) 10 MG tablet Take 10 mg by mouth daily.    Historical Provider, MD  benazepril (LOTENSIN) 20 MG tablet Take 20 mg by mouth daily.    Historical Provider, MD  dexamethasone (DECADRON) 4 MG tablet Take 4 mg by mouth 2 (two) times daily with a meal.    Historical Provider, MD  metFORMIN (GLUCOPHAGE) 1000 MG tablet Take 1 tablet (1,000 mg total) by mouth daily with breakfast. 09/11/15   Eliezer Bottom, NP  ondansetron (ZOFRAN) 4 MG tablet  Take 4 mg by mouth every 8 (eight) hours as needed for nausea or vomiting.    Historical Provider, MD  OVER THE COUNTER MEDICATION Take 2 capsules by mouth 2 (two) times daily. Medication: Circulation and vein support    Historical Provider, MD  potassium chloride (K-DUR,KLOR-CON) 10 MEQ tablet Take 10 mEq by mouth daily.    Historical Provider, MD  valsartan-hydrochlorothiazide (DIOVAN-HCT) 160-12.5 MG tablet Take 1 tablet by mouth daily.    Historical Provider, MD   BP 111/66 mmHg  Pulse 83  Resp 22  SpO2 100% Physical Exam  Constitutional: She is oriented to person, place, and time. She appears well-developed and well-nourished. No distress.  Laying flat in bed  HENT:  Head: Normocephalic and atraumatic.  Mouth/Throat: No oropharyngeal exudate.  Dry MM  Eyes: Conjunctivae and EOM are normal. Pupils are equal, round, and reactive to light. No scleral icterus.  Neck: Normal range of motion. Neck supple.  Cardiovascular: Normal rate, regular rhythm, normal heart sounds and intact distal pulses.   No murmur heard. Pulmonary/Chest: Effort normal and breath sounds normal. No respiratory distress. She has no wheezes. She has no rales. She exhibits no tenderness.  On RA  Abdominal: Soft. Bowel sounds are normal. She exhibits no distension. There is no rebound and no guarding.  Moderate TTP in LLQ and L flank. No CVAT  Musculoskeletal:  Trace edema. Bilateral calf tenderness, negative Homan's. Diffuse bony tenderness including over L lateral rib cage  Lymphadenopathy:    She has no cervical adenopathy.  Neurological: She is alert and oriented to person, place, and time.  Skin: Skin is warm and dry. No rash noted.  Psychiatric: She has a normal mood and affect. Her behavior is normal.    ED Course  Procedures (including critical care time) Labs Review Labs Reviewed  CBC - Abnormal; Notable for the following:    RBC 3.75 (*)    Hemoglobin 11.1 (*)    HCT 34.6 (*)    RDW 19.0 (*)     All other components within normal limits  COMPREHENSIVE METABOLIC PANEL  I-STAT TROPOININ, ED    Imaging Review No results found. I have personally reviewed and evaluated these images and lab results as part of my medical decision-making.   EKG Interpretation None      MDM   Final diagnoses:  None    70 y.o. F with known metastatic breast cancer on chemotherapy presenting with abd pain and SOB.  EKG with sinus tachycardia and nonspecific ST depression.  Troponin 0.03.  Patient with signs of dehydration on arrival with hypotension, tachycardia, and dry MM.  BP and pulse improved with IVF.  Due to dehydration and pain control, patient to be admitted.  Spoke with IMTS who agrees to admit patient to service.     Virginia Crews, MD 09/22/15 [place], MD 09/22/15 [phone]

## 2015-09-22 NOTE — ED Notes (Signed)
Patient transported back to X-ray.

## 2015-09-23 DIAGNOSIS — C7889 Secondary malignant neoplasm of other digestive organs: Secondary | ICD-10-CM | POA: Diagnosis not present

## 2015-09-23 DIAGNOSIS — Z7984 Long term (current) use of oral hypoglycemic drugs: Secondary | ICD-10-CM | POA: Diagnosis not present

## 2015-09-23 DIAGNOSIS — C7949 Secondary malignant neoplasm of other parts of nervous system: Secondary | ICD-10-CM | POA: Diagnosis present

## 2015-09-23 DIAGNOSIS — E860 Dehydration: Secondary | ICD-10-CM | POA: Diagnosis present

## 2015-09-23 DIAGNOSIS — E43 Unspecified severe protein-calorie malnutrition: Secondary | ICD-10-CM | POA: Diagnosis present

## 2015-09-23 DIAGNOSIS — H5461 Unqualified visual loss, right eye, normal vision left eye: Secondary | ICD-10-CM | POA: Diagnosis present

## 2015-09-23 DIAGNOSIS — R630 Anorexia: Secondary | ICD-10-CM | POA: Diagnosis present

## 2015-09-23 DIAGNOSIS — Z87891 Personal history of nicotine dependence: Secondary | ICD-10-CM | POA: Diagnosis not present

## 2015-09-23 DIAGNOSIS — K5900 Constipation, unspecified: Secondary | ICD-10-CM | POA: Diagnosis present

## 2015-09-23 DIAGNOSIS — E872 Acidosis: Secondary | ICD-10-CM | POA: Diagnosis present

## 2015-09-23 DIAGNOSIS — Z6830 Body mass index (BMI) 30.0-30.9, adult: Secondary | ICD-10-CM | POA: Diagnosis not present

## 2015-09-23 DIAGNOSIS — I10 Essential (primary) hypertension: Secondary | ICD-10-CM

## 2015-09-23 DIAGNOSIS — Z66 Do not resuscitate: Secondary | ICD-10-CM | POA: Diagnosis present

## 2015-09-23 DIAGNOSIS — C7800 Secondary malignant neoplasm of unspecified lung: Secondary | ICD-10-CM | POA: Diagnosis present

## 2015-09-23 DIAGNOSIS — C50919 Malignant neoplasm of unspecified site of unspecified female breast: Secondary | ICD-10-CM | POA: Diagnosis present

## 2015-09-23 DIAGNOSIS — Z7952 Long term (current) use of systemic steroids: Secondary | ICD-10-CM | POA: Diagnosis not present

## 2015-09-23 DIAGNOSIS — I959 Hypotension, unspecified: Secondary | ICD-10-CM | POA: Diagnosis present

## 2015-09-23 DIAGNOSIS — C787 Secondary malignant neoplasm of liver and intrahepatic bile duct: Secondary | ICD-10-CM | POA: Diagnosis present

## 2015-09-23 DIAGNOSIS — C7951 Secondary malignant neoplasm of bone: Secondary | ICD-10-CM | POA: Diagnosis present

## 2015-09-23 LAB — COMPREHENSIVE METABOLIC PANEL
ALT: 51 U/L (ref 14–54)
AST: 66 U/L — AB (ref 15–41)
Albumin: 2.8 g/dL — ABNORMAL LOW (ref 3.5–5.0)
Alkaline Phosphatase: 68 U/L (ref 38–126)
Anion gap: 7 (ref 5–15)
BILIRUBIN TOTAL: 0.9 mg/dL (ref 0.3–1.2)
BUN: 19 mg/dL (ref 6–20)
CHLORIDE: 108 mmol/L (ref 101–111)
CO2: 22 mmol/L (ref 22–32)
CREATININE: 0.54 mg/dL (ref 0.44–1.00)
Calcium: 7.9 mg/dL — ABNORMAL LOW (ref 8.9–10.3)
Glucose, Bld: 144 mg/dL — ABNORMAL HIGH (ref 65–99)
POTASSIUM: 4.1 mmol/L (ref 3.5–5.1)
Sodium: 137 mmol/L (ref 135–145)
TOTAL PROTEIN: 5 g/dL — AB (ref 6.5–8.1)

## 2015-09-23 LAB — CBC
HCT: 25.7 % — ABNORMAL LOW (ref 36.0–46.0)
Hemoglobin: 8.1 g/dL — ABNORMAL LOW (ref 12.0–15.0)
MCH: 29.3 pg (ref 26.0–34.0)
MCHC: 31.5 g/dL (ref 30.0–36.0)
MCV: 93.1 fL (ref 78.0–100.0)
PLATELETS: 166 10*3/uL (ref 150–400)
RBC: 2.76 MIL/uL — ABNORMAL LOW (ref 3.87–5.11)
RDW: 19.4 % — AB (ref 11.5–15.5)
WBC: 4.9 10*3/uL (ref 4.0–10.5)

## 2015-09-23 MED ORDER — INSULIN ASPART 100 UNIT/ML ~~LOC~~ SOLN: 0.0000 [IU] | Freq: Three times a day (TID) | SUBCUTANEOUS | Status: DC

## 2015-09-23 MED ORDER — DRONABINOL 2.5 MG PO CAPS
2.5000 mg | ORAL_CAPSULE | Freq: Two times a day (BID) | ORAL | Status: DC
  Administered 2015-09-23 – 2015-09-24 (×2): 2.5 mg via ORAL
  Filled 2015-09-23 (×3): qty 1

## 2015-09-23 MED ORDER — BISACODYL 10 MG RE SUPP
10.0000 mg | Freq: Once | RECTAL | Status: AC
  Administered 2015-09-23: 10 mg via RECTAL
  Filled 2015-09-23: qty 1

## 2015-09-23 MED ORDER — SODIUM CHLORIDE 0.9 % IV SOLN
INTRAVENOUS | Status: DC
  Administered 2015-09-23 (×2): via INTRAVENOUS

## 2015-09-23 MED ORDER — SODIUM CHLORIDE 0.9 % IJ SOLN: 10.0000 mL | INTRAMUSCULAR | Status: DC | PRN

## 2015-09-23 MED ORDER — POLYETHYLENE GLYCOL 3350 17 G PO PACK
17.0000 g | PACK | Freq: Every day | ORAL | Status: DC
  Administered 2015-09-23 – 2015-09-24 (×2): 17 g via ORAL
  Filled 2015-09-23 (×2): qty 1

## 2015-09-23 MED ORDER — SODIUM CHLORIDE 0.9 % IJ SOLN
10.0000 mL | INTRAMUSCULAR | Status: DC | PRN
  Administered 2015-09-23: 10 mL
  Filled 2015-09-23: qty 40

## 2015-09-23 NOTE — Progress Notes (Signed)
Pt admitted to 6e08 from ED via stretcher. Alert and oriented x 4, but states feels a little fuzzy with her thinking sometimes. Pt is from [address]nd floor apartment and states her grandson is her caregiver. Recent move from [place]. Last chemotherapy 09/05/2015. States has information for local PCP. Skin is in good condition. Noted porta cath R chest. IV team notified to access. Denies suicide ideation, but reports feelings of wanting to give up. Denies spiritual needs, but expressed appreciated for information about chaplain availability. States has lost 20 pounds since last chemo. Nothing tastes right, no appetite. Telemetry placed on patient. 2 step verification completed. NS @ 125 mL/hr initiated via L hand NSL. Will continue to monitor. Bartholomew Crews, RN

## 2015-09-23 NOTE — Progress Notes (Signed)
Subjective:  [name] says she had minimal abdominal discomfort this morning and felt as though she may have an impending bowel movement. She reports that she does not have nausea between meals, only when she eats since she's lost her taste for food.   Objective: Vital signs in last 24 hours: Filed Vitals:   09/22/15 2230 09/22/15 2312 09/23/15 0435 09/23/15 0935  BP: 111/61 102/58 108/53 104/41  Pulse: 66 62 65 67  Temp:  97.9 F (36.6 C) 98.9 F (37.2 C) 98.6 F (37 C)  TempSrc:    Oral  Resp: 21 22 20 20   Height:  5' 5"  (1.651 m)    Weight:  184 lb (83.462 kg)    SpO2: 96% 97% 98% 98%   Weight change:   Intake/Output Summary (Last 24 hours) at 09/23/15 1118 Last data filed at 09/23/15 0900  Gross per 24 hour  Intake 1023.33 ml  Output      0 ml  Net 1023.33 ml   Physical Exam General: Lying in bed, appearing slightly uncomfortable HEENT: EOM grossly normal, no scleral icterus, moist mucous membranes Cardiovascular: RRR. No m/r/g Pulmonary: CTAB. Unlabored breathing Abdominal: Soft, NT/ND. Normal bowel sounds Extremities: No clubbing, cyanosis, or edema Psychiatric: Normal affect and behavior    Lab Results: Basic Metabolic Panel:  Recent Labs Lab 09/22/15 1518 09/23/15 0415  NA 133* 137  K 5.0 4.1  CL 102 108  CO2 17* 22  GLUCOSE 273* 144*  BUN 28* 19  CREATININE 0.86 0.54  CALCIUM 8.7* 7.9*   Liver Function Tests:  Recent Labs Lab 09/22/15 1518 09/23/15 0415  AST 57* 66*  ALT 59* 51  ALKPHOS 84 68  BILITOT 1.2 0.9  PROT 6.0* 5.0*  ALBUMIN 3.3* 2.8*    Recent Labs Lab 09/22/15 1518  LIPASE 75*   No results for input(s): AMMONIA in the last 168 hours. CBC:  Recent Labs Lab 09/22/15 1518  WBC 7.0  HGB 11.1*  HCT 34.6*  MCV 92.3  PLT 224   Urinalysis:  Recent Labs Lab 09/22/15 1950  COLORURINE AMBER*  LABSPEC 1.023  PHURINE 5.0  GLUCOSEU 100*  HGBUR NEGATIVE  BILIRUBINUR SMALL*  KETONESUR 15*  PROTEINUR NEGATIVE    NITRITE NEGATIVE  LEUKOCYTESUR NEGATIVE    Micro Results: No results found for this or any previous visit (from the past 240 hour(s)). Studies/Results: Dg Chest 2 View  09/22/2015  CLINICAL DATA:  Shortness of Breath EXAM: CHEST - 2 VIEW COMPARISON:  None. FINDINGS: Cardiac shadows within normal limits. Mild interstitial changes are noted bilaterally. No focal infiltrate or sizable effusion is seen. Mottled sclerosis is noted over the bony structures of the spine as well as in the proximal left humerus which may be related to bony metastatic disease. Clinical correlation is recommended. IMPRESSION: Mild interstitial changes without focal infiltrate. Changes suggestive of bony metastatic disease. Clinical correlation with the given history is recommended. Electronically Signed   By: Inez Catalina M.D.   On: 09/22/2015 16:35   Dg Abd 2 Views  09/22/2015  CLINICAL DATA:  Abdominal pain, nausea, vomiting EXAM: ABDOMEN - 2 VIEW COMPARISON:  None. FINDINGS: The bowel gas pattern is normal. There is no evidence of free air. No radio-opaque calculi or other significant radiographic abnormality is seen. IMPRESSION: Negative. Electronically Signed   By: Kathreen Devoid   On: 09/22/2015 17:12   Medications: I have reviewed the patient's current medications. Scheduled Meds: . dexamethasone  4 mg Intravenous Q12H  . dronabinol  2.5 mg Oral BID AC  . enoxaparin (LOVENOX) injection  40 mg Subcutaneous Q24H   Continuous Infusions: . sodium chloride 75 mL/hr at 09/23/15 0925   PRN Meds:.acetaminophen, ketorolac, ondansetron **OR** ondansetron (ZOFRAN) IV, sodium chloride Assessment/Plan:  Constipation in the setting of Poor Appetite: Patient has not had a BM in 5+ days, although she feels she may this AM. No evidence of obstruction on abdominal X-ray. No abdominal tenderness on my exam. Her constipation is likely driven by her lack of appetite secondary to her underlying malignancy and perhaps her previous  chemotherapy. I will start on orexigenic agents to see if her appetite improves, in hopes of stimulating a bowel movement.  - Marinol 2.5 mg BID before meals - Miralax daily, sennakot-s BID - Zofran prn - Clear liquid diet - NS 75 cc/hr, will transition to D5 1/2 NS if still unable to tolerate meals  Stage IV  Breast Cancer: Unclear on ER/PR/HER-2 status based on oncology notes. Metastases to lung, liver, spleen, bone, and meninges. Plans to restart chemotherapy (last dose early in January in [place]) and [place] (Dr. Marin Olp). Unclear if the goal of future chemotherapy is palliation or remission.  - Toradol 15 mg q6h  - Tylenol 650 mg q6h PRN - Decadron 4 mg IV BID  HTN: Patient still hypotensive this AM to 104/40, likely in setting of poor po intake. Home antihypertensives have been held. Med record shows patient on Amlodipine, Benazapril, and Valsartan-HCTZ. Will need med reconciliation - likely discontinue benazepril. Patient already has PCP new patient visit scheduled. - HOLD Amlodipine, Benazapril, Diovan  DVT Prophylaxis: Lovenox [place]  Code Status: DNR  Dispo: Disposition is deferred at this time, awaiting improvement of current medical problems.  Anticipated discharge in approximately 1-2 day(s).   The patient does not have a current PCP (No Pcp Per Patient) and does not need an [facility] hospital follow-up appointment after discharge.  The patient does have transportation limitations that hinder transportation to clinic appointments.  .Services Needed at time of discharge: Y = Yes, Blank = No PT:   OT:   RN:   Equipment:   Other:       Liberty Handy, MD 09/23/2015, 11:18 AM

## 2015-09-24 DIAGNOSIS — E43 Unspecified severe protein-calorie malnutrition: Secondary | ICD-10-CM | POA: Insufficient documentation

## 2015-09-24 DIAGNOSIS — R432 Parageusia: Secondary | ICD-10-CM

## 2015-09-24 LAB — CBC
HEMATOCRIT: 24.5 % — AB (ref 36.0–46.0)
Hemoglobin: 7.8 g/dL — ABNORMAL LOW (ref 12.0–15.0)
MCH: 30 pg (ref 26.0–34.0)
MCHC: 31.8 g/dL (ref 30.0–36.0)
MCV: 94.2 fL (ref 78.0–100.0)
PLATELETS: 146 10*3/uL — AB (ref 150–400)
RBC: 2.6 MIL/uL — AB (ref 3.87–5.11)
RDW: 19.3 % — AB (ref 11.5–15.5)
WBC: 4.5 10*3/uL (ref 4.0–10.5)

## 2015-09-24 LAB — BASIC METABOLIC PANEL
Anion gap: 5 (ref 5–15)
BUN: 16 mg/dL (ref 6–20)
CALCIUM: 7.7 mg/dL — AB (ref 8.9–10.3)
CO2: 22 mmol/L (ref 22–32)
CREATININE: 0.5 mg/dL (ref 0.44–1.00)
Chloride: 110 mmol/L (ref 101–111)
GFR calc non Af Amer: >60 mL/min (ref >60)
GLUCOSE: 185 mg/dL — AB (ref 65–99)
Potassium: 4.5 mmol/L (ref 3.5–5.1)
Sodium: 137 mmol/L (ref 135–145)

## 2015-09-24 LAB — GLUCOSE, CAPILLARY: GLUCOSE-CAPILLARY: 144 mg/dL — AB (ref 65–99)

## 2015-09-24 MED ORDER — HEPARIN SOD (PORK) LOCK FLUSH 100 UNIT/ML IV SOLN
500.0000 [IU] | INTRAVENOUS | Status: AC | PRN
Start: 2015-09-24 — End: 2015-09-24
  Administered 2015-09-24: 500 [IU]

## 2015-09-24 MED ORDER — DRONABINOL 2.5 MG PO CAPS: 2.5000 mg | ORAL_CAPSULE | Freq: Two times a day (BID) | ORAL | Status: DC

## 2015-09-24 NOTE — Progress Notes (Signed)
Initial Nutrition Assessment  DOCUMENTATION CODES:   Severe malnutrition in context of acute illness/injury, Obesity unspecified  INTERVENTION:  Provide Boost Breeze po TID, each supplement provides 250 kcal and 9 grams of protein.  Encourage adequate PO intake.   NUTRITION DIAGNOSIS:   Inadequate oral intake related to poor appetite as evidenced by  (varied meal completion 20-100%).  GOAL:   Patient will meet greater than or equal to 90% of their needs  MONITOR:   PO intake, Supplement acceptance, Diet advancement, Weight trends, Labs, I & O's  REASON FOR ASSESSMENT:   Malnutrition Screening Tool    ASSESSMENT:   70 yo female with stage IV ER/PR/HER2+ breast cancer to meninges, lung, liver, spleen, and bone and HTN, presenting with a 5-6 day h/o diffuse abdominal pain.  Meal completion has been 20-100%. Pt reports having poor po over the past 6 days due to abdominal pain and nausea. She reports only consuming at most a yogurt and a couple of bites of food at meals daily over the past 6 days. Per Epic weight records, pt with a 9.4% weight loss in 11 days. Pt is agreeable to Boost Breeze to aid in caloric and protein needs. RD to order.   Pt with no observed significant fat or muscle mass loss.   Labs and medications reviewed.   Diet Order:  Diet clear liquid Room service appropriate?: Yes; Fluid consistency:: Thin  Skin:  Reviewed, no issues  Last BM:  1/23  Height:   Ht Readings from Last 1 Encounters:  09/22/15 5' 5"  (1.651 m)    Weight:   Wt Readings from Last 1 Encounters:  09/22/15 184 lb (83.462 kg)    Ideal Body Weight:  56.8 kg  BMI:  Body mass index is 30.62 kg/(m^2).  Estimated Nutritional Needs:   Kcal:  1850-2100  Protein:  100-110 grams  Fluid:  1.8 - 2.1 L/day  EDUCATION NEEDS:   No education needs identified at this time  Corrin Parker, MS, RD, LDN Pager # [phone] After hours/ weekend pager # [phone]

## 2015-09-24 NOTE — Discharge Summary (Signed)
Name: Kristen Ruiz MRN: [id number] DOB: 01/26/1946 70 y.o. PCP: No Pcp Per Patient  Date of Admission: 09/22/2015  3:14 PM Date of Discharge: 09/24/2015 Attending Physician: Aldine Contes, MD Discharge Diagnosis: 1. Breast Cancer Metastasized to Multiple Sites 2. Dysgeusia 3. Poor Appetite 4. HTN  Discharge Medications:   Medication List    STOP taking these medications        amLODipine 10 MG tablet  Commonly known as:  NORVASC     benazepril 20 MG tablet  Commonly known as:  LOTENSIN     potassium chloride 10 MEQ tablet  Commonly known as:  K-DUR,KLOR-CON     valsartan-hydrochlorothiazide 160-12.5 MG tablet  Commonly known as:  DIOVAN-HCT      TAKE these medications        dexamethasone 4 MG tablet  Commonly known as:  DECADRON  Take 4 mg by mouth 2 (two) times daily with a meal.     dronabinol 2.5 MG capsule  Commonly known as:  MARINOL  Take 1 capsule (2.5 mg total) by mouth 2 (two) times daily before lunch and supper.     metFORMIN 1000 MG tablet  Commonly known as:  GLUCOPHAGE  Take 1 tablet (1,000 mg total) by mouth daily with breakfast.     ondansetron 4 MG tablet  Commonly known as:  ZOFRAN  Take 4 mg by mouth every 8 (eight) hours as needed for nausea or vomiting.     OVER THE COUNTER MEDICATION  Take 2 capsules by mouth 2 (two) times daily. Medication: Circulation and vein support        Disposition and follow-up:   Kristen Ruiz was discharged from [facility] in Stable condition.  At the hospital follow up visit please address:  1.  She was started on an appetite stimulant in the hospital which appears to have facilitated a resolution in her hypotension and constipation. Monitor for sustained appetite  2. Needs to establish here for primary care and oncology treatment.  3. Address whether future chemotherapy is palliative or curative  4. Address need for antihypertensives. These were held on discharge.   5. Labs  / imaging needed at time of follow-up: None  6. Pending labs/ test needing follow-up: None  Follow-up Appointments: Follow-up Information    Follow up with [facility].      Follow up with Kristen Ruiz., NP On 09/26/2015.   Specialty:  Internal Medicine   Why:  At 11:15 after Oncology Visit   Contact information:   [address] [place] [address] [phone]       Discharge Instructions: Discharge Instructions    Increase activity slowly    Complete by:  As directed           STOP taking your blood pressure medications. When you follow up with your PCP on 2/15 you can talk to her about resuming your blood pressure medications.   Start taking Marinol 2.48m twice a day to help with your appetite.   Procedures Performed:  Dg Chest 2 View  09/22/2015  CLINICAL DATA:  Shortness of Breath EXAM: CHEST - 2 VIEW COMPARISON:  None. FINDINGS: Cardiac shadows within normal limits. Mild interstitial changes are noted bilaterally. No focal infiltrate or sizable effusion is seen. Mottled sclerosis is noted over the bony structures of the spine as well as in the proximal left humerus which may be related to bony metastatic disease. Clinical correlation is recommended. IMPRESSION: Mild interstitial changes  without focal infiltrate. Changes suggestive of bony metastatic disease. Clinical correlation with the given history is recommended. Electronically Signed   By: Inez Catalina M.D.   On: 09/22/2015 16:35   Dg Abd 2 Views  09/22/2015  CLINICAL DATA:  Abdominal pain, nausea, vomiting EXAM: ABDOMEN - 2 VIEW COMPARISON:  None. FINDINGS: The bowel gas pattern is normal. There is no evidence of free air. No radio-opaque calculi or other significant radiographic abnormality is seen. IMPRESSION: Negative. Electronically Signed   By: Kathreen Devoid   On: 09/22/2015 17:12    Admission HPI: Kristen Ruiz is a 70 yo female with stage IV ER/PR/HER2+ breast cancer to meninges,  lung, liver, spleen, and bone and HTN, presenting with a 5-6 day h/o diffuse abdominal pain. She reports a "gassy" abdominal pain starting on the left side, now more diffuse throughout the abdomen. Pain is worse with sitting straight up and relieved with lyign flat. She is continuing to pass gas, which helps with the abdominal pain. She is constipated at baseline, but tries to have a BM daily. She has not had a BM in the last 5-6 days. She does not take any medications for constipation. She endorses mild, intermittent nausea and vomiting. She has also had decreased PO intake for the last 5 days 2/2 to the abdominal pain. She has been unable to take her HTN medications, but did manage to take her Decadron this morning.   She also complains of DOE for the last few weeks that is unchanged. Her breathing improves with lying flat. She denies orthopnea, PND, or weight gain. She may have intermittent leg swelling. She has had bilateral calf pain for the last year, described as "cramping." She has been taking homeopathic medication for the calf cramping. She denies worsening calf pain or erythema.   Patient was diagnosed with metastatic breast cancer in 2013. 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 She has also metastases to lung, bone, liver, and spleen. She is on Decadron 4 mg BID for the meningeal involvement. She is establishing care with [facility]. She recently moved from [place], where she was previously being treated. She has not restarted chemotherapy, but plans to start Cytoxan and Adriamycin. Of note, the MD attestation to her cancer center visit states she is HER2- and plans to start Cytoxan and Docutaxel.  In the ED, her BP was 80-90/40-50. She had a positive AG metabolic acidosis with U/A positive for ketones. She was given 2 L NS with appropriate response in BP. Decubitus AXR was negative for free air. Supine AXR demonstrated  normal bowel gas pattern. CXR was negative for effusion, but showed sclerosis of spine and humerus. Patient denies bone pain.  Hospital Course by problem list: Active Problems:   Breast cancer metastasized to multiple sites [facility])   Constipation   Essential hypertension   Protein-calorie malnutrition, severe   Constipation in the setting of Poor Appetite: The patient's abdominal X-ray revealed no signs of obstruction. The patient continued to have no bowel movements while her oral intake was minimal. Her dysgeusia improved with the addition of Marinol 2.5 mg BID and had two bowel movements on the day of discharge. This was assisted with Miralax and [place] daily.  Stage IVBreast Cancer: Stable during admission. Pain was managed with Toradol 15 mg q6h and headaches to decadron 4 mg IV BID. She was aware of follow-up with oncologist on 1/25. She was evaluated by PT on day of discharge  and set up with home PT services.  HTN: BP low normal during admission, orthostatic vitals negative on day of discharge. Therefore, antihypertensives were held and will be reassessed for resumption with PCP visit on 2/15.  Discharge Vitals:   BP 103/41 mmHg  Pulse 74  Temp(Src) 98.7 F (37.1 C) (Oral)  Resp 18  Ht 5' 5"  (1.651 m)  Wt 184 lb (83.462 kg)  BMI 30.62 kg/m2  SpO2 97%  Discharge Labs:  Results for orders placed or performed during the hospital encounter of 09/22/15 (from the past 24 hour(s))  CBC     Status: Abnormal   Collection Time: 09/23/15  9:52 PM  Result Value Ref Range   WBC 4.9 4.0 - 10.5 K/uL   RBC 2.76 (L) 3.87 - 5.11 MIL/uL   Hemoglobin 8.1 (L) 12.0 - 15.0 g/dL   HCT 25.7 (L) 36.0 - 46.0 %   MCV 93.1 78.0 - 100.0 fL   MCH 29.3 26.0 - 34.0 pg   MCHC 31.5 30.0 - 36.0 g/dL   RDW 19.4 (H) 11.5 - 15.5 %   Platelets 166 150 - 400 K/uL  Basic metabolic panel     Status: Abnormal   Collection Time: 09/24/15  4:30 AM  Result Value Ref Range   Sodium 137 135 - 145 mmol/L    Potassium 4.5 3.5 - 5.1 mmol/L   Chloride 110 101 - 111 mmol/L   CO2 22 22 - 32 mmol/L   Glucose, Bld 185 (H) 65 - 99 mg/dL   BUN 16 6 - 20 mg/dL   Creatinine, Ser 0.50 0.44 - 1.00 mg/dL   Calcium 7.7 (L) 8.9 - 10.3 mg/dL   GFR calc non Af Amer >60 >60 mL/min   GFR calc Af Amer >60 >60 mL/min   Anion gap 5 5 - 15  Glucose, capillary     Status: Abnormal   Collection Time: 09/24/15  7:36 AM  Result Value Ref Range   Glucose-Capillary 144 (H) 65 - 99 mg/dL  CBC     Status: Abnormal   Collection Time: 09/24/15 10:00 AM  Result Value Ref Range   WBC 4.5 4.0 - 10.5 K/uL   RBC 2.60 (L) 3.87 - 5.11 MIL/uL   Hemoglobin 7.8 (L) 12.0 - 15.0 g/dL   HCT 24.5 (L) 36.0 - 46.0 %   MCV 94.2 78.0 - 100.0 fL   MCH 30.0 26.0 - 34.0 pg   MCHC 31.8 30.0 - 36.0 g/dL   RDW 19.3 (H) 11.5 - 15.5 %   Platelets 146 (L) 150 - 400 K/uL    Signed: Liberty Handy, MD 09/24/2015, 3:17 PM

## 2015-09-24 NOTE — Discharge Instructions (Signed)
STOP taking your blood pressure medications. When you follow up with your PCP on 2/15 you can talk to her about resuming your blood pressure medications.   Start taking Marinol 2.5mg  twice a day to help with your appetite.   Dronabinol, THC capsules What is this medicine? DRONABINOL (droe NAB i nol) is used to treat nausea and vomiting caused by cancer treatment, especially for those patients who do not respond to other medicines. This medicine is also used to increase appetite in AIDS patients. This medicine may be used for other purposes; ask your health care provider or pharmacist if you have questions. What should I tell my health care provider before I take this medicine? They need to know if you have any of these conditions: -a history of drug or alcohol abuse -heart disease, including angina or irregular heart rate -high or low blood pressure -dizziness or fainting spells on standing -mental health problems (schizophrenia, mania, depression) -an unusual or allergic reaction to dronabinol, marijuana, sesame oil, other medicines, foods, dyes, or preservatives -pregnant or trying to get pregnant -breast-feeding How should I use this medicine? Take this medicine by mouth. Follow the directions on the prescription label. Take your doses at regular intervals. Do not take your medicine more often than directed. Talk to your pediatrician regarding the use of this medicine in children. Special care may be needed. Overdosage: If you think you have taken too much of this medicine contact a poison control center or emergency room at once. NOTE: This medicine is only for you. Do not share this medicine with others. What if I miss a dose? If you miss a dose, take it as soon as you can. If it is almost time for your next dose, take only that dose. Do not take double or extra doses. What may interact with this medicine? Do not take this medicine with any of the following  medications: -nabilone This medicine may also interact with the following medications: -alcohol-containing medicines or drinks -amphetamine or other stimulant drugs -barbiturates such as phenobarbital -certain antidepressants or tranquilizers -certain antihistamines used in cold medicines -cocaine -disulfiram -muscle relaxants -theophylline This list may not describe all possible interactions. Give your health care provider a list of all the medicines, herbs, non-prescription drugs, or dietary supplements you use. Also tell them if you smoke, drink alcohol, or use illegal drugs. Some items may interact with your medicine. What should I watch for while using this medicine? The first time you take this medicine or have an increase in dose make sure there is a responsible person nearby. You may experience mood changes, easy laughter, or other changes in behavior. You may get drowsy or dizzy. Do not drive, use machinery, or do anything that needs mental alertness until you know how this medicine affects you. Do not stand or sit up quickly, especially if you have low blood pressure or if you are an older patient. This reduces the risk of dizzy or fainting spells. Alcohol may interfere with the effect of this medicine. Avoid alcoholic drinks. If you are taking this medicine to improve your appetite, this is only part of your therapy. Discuss what you can eat and ways of improving your diet with your health care professional or nutritionist. Frequent small snacks as well as regular meals can help provide extra calories and protein. Do not smoke marijuana while you are taking this medicine. It is similar to one of the active substances found in marijuana. You are at increased risk of serious  heart and/or nervous system side effects if these drugs are used together. What side effects may I notice from receiving this medicine? Side effects that you should report to your doctor or health care professional as  soon as possible: -allergic reactions like skin rash, itching or hives, swelling of the face, lips, or tongue -fast, irregular heartbeat -feeling faint or lightheaded, falls -hallucinations -panic reactions -slurred speech Side effects that usually do not require medical attention (report to your doctor or health care professional if they continue or are bothersome): -anxiety or nervousness -confusion -nausea, vomiting or diarrhea -stomach upset This list may not describe all possible side effects. Call your doctor for medical advice about side effects. You may report side effects to FDA at 1-800-FDA-1088. Where should I keep my medicine? This medicine may cause accidental overdose and death if taken by other adults, children, or pets. Keep out of the reach of children. This medicine can be abused. Keep your medicine in a safe place to protect it from theft. Do not share this medicine with anyone. Selling or giving away this medicine is dangerous and against the law. Store in a cool place between 8 and 15 degrees C (46 and 59 degrees F) or in a refrigerator. Avoid freezing. Mix any unused medicine with a substance like cat litter or coffee grounds. Then throw the medicine away in a sealed container like a sealed bag or a coffee can with a lid. Do not use the medicine after the expiration date. NOTE: This sheet is a summary. It may not cover all possible information. If you have questions about this medicine, talk to your doctor, pharmacist, or health care provider.    2016, [employer]vier/Gold Standard. (2014-05-09 15:19:53)

## 2015-09-24 NOTE — Evaluation (Signed)
Physical Therapy Evaluation Patient Details Name: Kristen Ruiz MRN: [id number] DOB: 1946-01-06 Today's Date: 09/24/2015   History of Present Illness  Briefly, Kristen Ruiz is a 70yo woman with metastatic breast cancer who recently moved from [place] and is in the process of setting up new doctors in [place]. She presented with left sided to diffuse abdominal pain, gassy in nature, no BM in about a week. She has also had decreased PO intake and intermittent nausea and vomiting. She further reports loss of taste sensation.  Clinical Impression  Pt is a pleasant female with the above medical history. PTA she was independent with ADs but most recently was requiring assistance with ADLs and running errands due to fatigue. Today pt is requiring Min A for ambulating and Mod A for transfers due to generalized weakness and decreased activity tolerance. Anticipating pt to improve tolerance as she becomes medically stable. Pt will benefit from continued skilled acute therapy to work on stair training. Upon D/C recommending HHPT along with 24/7 supervision/assistance from family, which pt is adamant she will have until she achieves her baseline level of modified independence.     Follow Up Recommendations Home health PT;Supervision/Assistance - 24 hour    Equipment Recommendations  None recommended by PT    Recommendations for Other Services       Precautions / Restrictions Precautions Precautions: Fall Restrictions Weight Bearing Restrictions: No      Mobility  Bed Mobility Overal bed mobility: Modified Independent             General bed mobility comments: Use of bed rails to sit EOB, increased time and rocking for momentum  Transfers Overall transfer level: Needs assistance Equipment used: Rolling walker (2 wheeled) Transfers: Sit to/from Stand Sit to Stand: Min assist;Mod assist         General transfer comment: Min A to stand from EOB with cues to push from bed before grabbing  RW. Mod A and 3 attempts to stand from commode. wide BOS and use of grab bar and toliet to stand  Ambulation/Gait Ambulation/Gait assistance: Min assist Ambulation Distance (Feet): 25 Feet (to/from bathroom) Assistive device: Rolling walker (2 wheeled) Gait Pattern/deviations: Wide base of support;Trunk flexed;Decreased stride length;Step-through pattern (decreased step height) Gait velocity: slow Gait velocity interpretation: <1.8 ft/sec, indicative of risk for recurrent falls General Gait Details: slow gait with increased work of breathing and quick to fatigue ambulating to bathroom  Stairs            Wheelchair Mobility    Modified Rankin (Stroke Patients Only)       Balance Overall balance assessment: Needs assistance Sitting-balance support: No upper extremity supported;Feet unsupported Sitting balance-Leahy Scale: Fair     Standing balance support: Bilateral upper extremity supported Standing balance-Leahy Scale: Fair Standing balance comment: Able to stand inside walker w/o UE support to gain orthostatic BPs                             Pertinent Vitals/Pain Pain Assessment: No/denies pain  Orthostatics taken during evaluation. Refer to vitals flow sheet    Home Living Family/patient expects to be discharged to:: Private residence Living Arrangements: Other relatives Available Help at Discharge: Family;Friend(s);Available 24 hours/day Type of Home: House Home Access: Stairs to enter Entrance Stairs-Rails: Can reach both Entrance Stairs-Number of Steps: 16 Home Layout: One level Home Equipment: Walker - 2 wheels;Shower seat;Cane - quad;Cane - single point      Prior Function  Level of Independence: Independent with assistive device(s)         Comments: for the past few weeks was needing assistance with household ADLs and grandson does the grocery shopping and errands     Hand Dominance   Dominant Hand: Right    Extremity/Trunk  Assessment   Upper Extremity Assessment: Overall WFL for tasks assessed           Lower Extremity Assessment: Generalized weakness      Cervical / Trunk Assessment: Normal  Communication   Communication: No difficulties  Cognition Arousal/Alertness: Awake/alert Behavior During Therapy: WFL for tasks assessed/performed Overall Cognitive Status: Within Functional Limits for tasks assessed                      General Comments General comments (skin integrity, edema, etc.): Requring Min A for bathroom ADLs. Deferred stair training and further ambulation due to fatigue    Exercises        Assessment/Plan    PT Assessment Patient needs continued PT services  PT Diagnosis Difficulty walking;Abnormality of gait;Generalized weakness   PT Problem List Decreased strength;Decreased activity tolerance;Decreased balance;Decreased coordination  PT Treatment Interventions Gait training;Stair training;Functional mobility training;Therapeutic activities;Balance training;Therapeutic exercise   PT Goals (Current goals can be found in the Care Plan section) Acute Rehab PT Goals Patient Stated Goal: be able to eat PT Goal Formulation: With patient Time For Goal Achievement: 10/08/15 Potential to Achieve Goals: Good    Frequency Min 3X/week   Barriers to discharge Inaccessible home environment 16 steps to enter apartment    Co-evaluation               End of Session Equipment Utilized During Treatment: Gait belt Activity Tolerance: Patient limited by fatigue Patient left: in bed;with call bell/phone within reach;with bed alarm set Nurse Communication: Mobility status         Time: [id number] PT Time Calculation (min) (ACUTE ONLY): 24 min   Charges:   PT Evaluation $PT Eval Moderate Complexity: 1 Procedure PT Treatments $Gait Training: 8-22 mins   PT G Codes:        Ara Kussmaul 10/10/15, 9:34 AM  Ara Kussmaul, Student Physical Therapist Acute Rehab  [phone]

## 2015-09-24 NOTE — Care Management Note (Signed)
Case Management Note  Patient Details  Name: Kristen Ruiz MRN: [id number] Date of Birth: 05-Oct-1945  Subjective/Objective:      CM following for progression and d/c planning.              Action/Plan: Met with pt and discussed HHPT, with pt insurance [employer], they arrange [facility] agencies to provided [facility] services. This pt has orders for HHPT and [facility] was notified and orders and H&P faxed to [phone] per [facility] request . Pt informed of process and this CM will notify pt when [facility] has made arrangements for HHPT.   Expected Discharge Date:       09/24/2015           Expected Discharge Plan:  [place]  In-House Referral:  NA  Discharge planning Services  CM Consult  Post Acute Care Choice:  Home Health Choice offered to:  Patient  DME Arranged:  N/A DME Agency:  NA  HH Arranged:  PT HH Agency:     Status of Service:  Completed, signed off  Medicare Important Message Given:    Date Medicare IM Given:    Medicare IM give by:    Date Additional Medicare IM Given:    Additional Medicare Important Message give by:     If discussed at [place] of Stay Meetings, dates discussed:    Additional Comments:  Adron Bene, RN 09/24/2015, 3:37 PM

## 2015-09-24 NOTE — Progress Notes (Signed)
Subjective:  Pt had one small BM last night and states abd pain has improved. She is tolerating a clear diet w/o n/v and actually said the food tasted good this morning.   Objective: Vital signs in last 24 hours: Filed Vitals:   09/23/15 0935 09/23/15 2043 09/24/15 0643 09/24/15 0720  BP: 104/41 98/38 101/44 103/41  Pulse: 67 67 67 74  Temp: 98.6 F (37 C) 98.6 F (37 C) 98.7 F (37.1 C) 98.7 F (37.1 C)  TempSrc: Oral Oral Oral Oral  Resp: _0 Height:      Weight:      SpO2: 98% 96% 98% 97%   Weight change:   Intake/Output Summary (Last 24 hours) at 09/24/15 1129 Last data filed at 09/24/15 0700  Gross per 24 hour  Intake 2243.75 ml  Output    154 ml  Net 2089.75 ml   Physical Exam General: Lying in bed, NAD HEENT: EOM grossly normal, no scleral icterus, moist mucous membranes Cardiovascular: RRR. No m/r/g Pulmonary: CTAB. Unlabored breathing Abdominal: Soft, NT/ND. Normal bowel sounds Extremities: No clubbing, cyanosis, or edema Psychiatric: Normal affect and behavior    Lab Results: Basic Metabolic Panel:  Recent Labs Lab 09/23/15 0415 09/24/15 0430  NA 137 137  K 4.1 4.5  CL 108 110  CO2 22 22  GLUCOSE 144* 185*  BUN 19 16  CREATININE 0.54 0.50  CALCIUM 7.9* 7.7*   Liver Function Tests:  Recent Labs Lab 09/22/15 1518 09/23/15 0415  AST 57* 66*  ALT 59* 51  ALKPHOS 84 68  BILITOT 1.2 0.9  PROT 6.0* 5.0*  ALBUMIN 3.3* 2.8*    Recent Labs Lab 09/22/15 1518  LIPASE 75*   CBC:  Recent Labs Lab 09/23/15 2152 09/24/15 1000  WBC 4.9 4.5  HGB 8.1* 7.8*  HCT 25.7* 24.5*  MCV 93.1 94.2  PLT 166 146*   Urinalysis:  Recent Labs Lab 09/22/15 1950  COLORURINE AMBER*  LABSPEC 1.023  PHURINE 5.0  GLUCOSEU 100*  HGBUR NEGATIVE  BILIRUBINUR SMALL*  KETONESUR 15*  PROTEINUR NEGATIVE  NITRITE NEGATIVE  LEUKOCYTESUR NEGATIVE    Studies/Results: Dg Chest 2 View  09/22/2015  CLINICAL DATA:  Shortness of Breath EXAM:  CHEST - 2 VIEW COMPARISON:  None. FINDINGS: Cardiac shadows within normal limits. Mild interstitial changes are noted bilaterally. No focal infiltrate or sizable effusion is seen. Mottled sclerosis is noted over the bony structures of the spine as well as in the proximal left humerus which may be related to bony metastatic disease. Clinical correlation is recommended. IMPRESSION: Mild interstitial changes without focal infiltrate. Changes suggestive of bony metastatic disease. Clinical correlation with the given history is recommended. Electronically Signed   By: Inez Catalina M.D.   On: 09/22/2015 16:35   Dg Abd 2 Views  09/22/2015  CLINICAL DATA:  Abdominal pain, nausea, vomiting EXAM: ABDOMEN - 2 VIEW COMPARISON:  None. FINDINGS: The bowel gas pattern is normal. There is no evidence of free air. No radio-opaque calculi or other significant radiographic abnormality is seen. IMPRESSION: Negative. Electronically Signed   By: Kathreen Devoid   On: 09/22/2015 17:12   Medications: I have reviewed the patient's current medications. Scheduled Meds: . dexamethasone  4 mg Intravenous Q12H  . dronabinol  2.5 mg Oral BID AC  . enoxaparin (LOVENOX) injection  40 mg Subcutaneous Q24H  . polyethylene glycol  17 g Oral Daily   Continuous Infusions: . sodium chloride 75 mL/hr at 09/23/15 2142  PRN Meds:.acetaminophen, ketorolac, ondansetron **OR** ondansetron (ZOFRAN) IV, sodium chloride Assessment/Plan:  Constipation in the setting of Poor Appetite: constipation has improved and abdominal pain has resolved. She is tolerating a clear diet and this morning states dysgeusia has resolved.  Orthostatic vitals negative this morning.  - Marinol 2.5 mg BID before meals - Miralax daily, sennakot-s BID - Zofran prn - Clear liquid diet - stopped IVFs - face to face placed for home health PT.   Stage IV  Breast Cancer: Unclear on ER/PR/HER-2 status based on oncology notes. Metastases to lung, liver, spleen, bone, and  meninges. Plans to restart chemotherapy (last dose early in January in [place]) and [place] (Dr. Marin Olp). Unclear if the goal of future chemotherapy is palliation or remission. He has an appt w/ her heme onc doctor on 09/26/15.   - Toradol 15 mg q6h  - Tylenol 650 mg q6h PRN - Decadron 4 mg IV BID  HTN: BP low normal, orthostatic vitals negative. Will hold anti hypertensives on d/c which she can have resumed by her PCP if needed on 2/15  DVT Prophylaxis: Lovenox [place]  Code Status: DNR  Dispo: Discharge home today, PCP appt w/ [place] on 2/15.   The patient does have transportation limitations that hinder transportation to clinic appointments.  .Services Needed at time of discharge: Y = Yes, Blank = No PT:   OT:   RN:   Equipment:   Other:     LOS: 1 day   Norman Herrlich, MD 09/24/2015, 11:29 AM

## 2015-09-25 ENCOUNTER — Telehealth: Payer: Self-pay | Admitting: General Practice

## 2015-09-25 ENCOUNTER — Encounter: Payer: Self-pay | Admitting: General Practice

## 2015-09-25 MED FILL — DRONABINOL 2.5 MG CAPSULE: 2.5 | 15 days supply | Qty: 30 | Fill #0

## 2015-09-25 NOTE — Telephone Encounter (Signed)
Before calling pt for pre-visit phone call, I noticed in chart that pt was admitted to the hospital on 09/22/15 and D/C on 09/24/15 with Kristen Ruiz. Should this pt have a TCC/TCM phone call since recommendations from  ED provider were to follow up with [facility] and Oncology. Pt is new to establish at our office

## 2015-09-25 NOTE — Telephone Encounter (Signed)
PCP: No Pcp Per Patient-----New to establish with Kristen Alar, NP tomorrow (09/26/15)  Date of Admission: 09/22/2015 3:14 PM Date of Discharge: 09/24/2015 Attending Physician: Aldine Contes, MD Discharge Diagnosis: 1. Breast Cancer Metastasized to Multiple Sites 2. Dysgeusia 3. Poor Appetite 4. HTN  Hospital follow up with Kristen Alar, NP tomorrow at 11:15 am.    Transition Care Management Follow-up Telephone Call  How have you been since you were released from the hospital? Pt states she's been doing okay.  Forcing herself to eat.  Confused by all the appts tomorrow (Lab, Office Visit and Infusion with Dr. Marin Ruiz, then New to Establish/Hospital Follow Up with [name]).     Do you understand why you were in the hospital? yes   Do you understand the discharge instructions? No, states all she knows is that she will be here (in the [name]) tomorrow morning at [name] office at 8 am.    Items Reviewed:  Medications reviewed: yes  Allergies reviewed: yes  Dietary changes reviewed: yes, pt encouraged to eat and drink  Referrals reviewed: yes, Dr. Marin Ruiz   Functional Questionnaire:   Activities of Daily Living (ADLs):  Requires assistance with ADLs.  Currently has grandson helping her.     Any transportation issues/concerns?: no, boyfriend will be bringing patient to her appt.     Any patient concerns? no   Confirmed importance and date/time of follow-up visits scheduled: yes   Confirmed with patient if condition begins to worsen call PCP or go to the ER: yes

## 2015-09-25 NOTE — Telephone Encounter (Signed)
Noted  

## 2015-09-26 ENCOUNTER — Ambulatory Visit (HOSPITAL_COMMUNITY)
Admission: RE | Admit: 2015-09-26 | Discharge: 2015-09-26 | Disposition: A | Discharge status: Home / Self Care | Payer: Managed Care, Other (non HMO) | Source: Ambulatory Visit | Attending: Hematology & Oncology | Admitting: Hematology & Oncology

## 2015-09-26 ENCOUNTER — Telehealth: Payer: Self-pay | Admitting: Family

## 2015-09-26 ENCOUNTER — Other Ambulatory Visit (HOSPITAL_BASED_OUTPATIENT_CLINIC_OR_DEPARTMENT_OTHER): Payer: Managed Care, Other (non HMO)

## 2015-09-26 ENCOUNTER — Ambulatory Visit: Payer: 59 | Admitting: Family

## 2015-09-26 ENCOUNTER — Ambulatory Visit (HOSPITAL_BASED_OUTPATIENT_CLINIC_OR_DEPARTMENT_OTHER): Payer: Managed Care, Other (non HMO)

## 2015-09-26 ENCOUNTER — Encounter: Payer: Self-pay | Admitting: Hematology & Oncology

## 2015-09-26 ENCOUNTER — Ambulatory Visit (HOSPITAL_BASED_OUTPATIENT_CLINIC_OR_DEPARTMENT_OTHER): Payer: Managed Care, Other (non HMO) | Admitting: Hematology & Oncology

## 2015-09-26 VITALS — BP 123/69 | HR 61 | Temp 98.1°F | Resp 18

## 2015-09-26 VITALS — BP 105/63 | HR 87 | Temp 97.5°F | Resp 16 | Ht 65.0 in

## 2015-09-26 DIAGNOSIS — C50919 Malignant neoplasm of unspecified site of unspecified female breast: Secondary | ICD-10-CM | POA: Diagnosis present

## 2015-09-26 DIAGNOSIS — Z171 Estrogen receptor negative status [ER-]: Secondary | ICD-10-CM

## 2015-09-26 DIAGNOSIS — K5903 Drug induced constipation: Secondary | ICD-10-CM

## 2015-09-26 DIAGNOSIS — C787 Secondary malignant neoplasm of liver and intrahepatic bile duct: Secondary | ICD-10-CM | POA: Diagnosis not present

## 2015-09-26 DIAGNOSIS — R739 Hyperglycemia, unspecified: Secondary | ICD-10-CM | POA: Diagnosis not present

## 2015-09-26 DIAGNOSIS — T380X5A Adverse effect of glucocorticoids and synthetic analogues, initial encounter: Secondary | ICD-10-CM

## 2015-09-26 LAB — CBC WITH DIFFERENTIAL (CANCER CENTER ONLY)
BASO#: 0 10*3/uL (ref 0.0–0.2)
BASO%: 0.4 % (ref 0.0–2.0)
EOS%: 0.1 % (ref 0.0–7.0)
Eosinophils Absolute: 0 10*3/uL (ref 0.0–0.5)
HCT: 26.6 % — ABNORMAL LOW (ref 34.8–46.6)
HEMOGLOBIN: 8.4 g/dL — AB (ref 11.6–15.9)
LYMPH#: 2.4 10*3/uL (ref 0.9–3.3)
LYMPH%: 30.5 % (ref 14.0–48.0)
MCH: 30 pg (ref 26.0–34.0)
MCHC: 31.6 g/dL — AB (ref 32.0–36.0)
MCV: 95 fL (ref 81–101)
MONO#: 0.5 10*3/uL (ref 0.1–0.9)
MONO%: 5.9 % (ref 0.0–13.0)
NEUT%: 63.1 % (ref 39.6–80.0)
NEUTROS ABS: 4.9 10*3/uL (ref 1.5–6.5)
Platelets: 176 10*3/uL (ref 145–400)
RBC: 2.8 10*6/uL — AB (ref 3.70–5.32)
RDW: 19.3 % — ABNORMAL HIGH (ref 11.1–15.7)

## 2015-09-26 LAB — CMP (CANCER CENTER ONLY)
ALT(SGPT): 59 U/L — ABNORMAL HIGH (ref 10–47)
AST: 55 U/L — ABNORMAL HIGH (ref 11–38)
Albumin: 3.1 g/dL — ABNORMAL LOW (ref 3.3–5.5)
Alkaline Phosphatase: 84 U/L (ref 26–84)
BUN: 11 mg/dL (ref 7–22)
CHLORIDE: 104 meq/L (ref 98–108)
CO2: 23 mEq/L (ref 18–33)
CREATININE: 0.5 mg/dL — AB (ref 0.6–1.2)
Calcium: 8.4 mg/dL (ref 8.0–10.3)
Glucose, Bld: 169 mg/dL — ABNORMAL HIGH (ref 73–118)
Potassium: 4.3 mEq/L (ref 3.3–4.7)
SODIUM: 140 meq/L (ref 128–145)
TOTAL PROTEIN: 5.8 g/dL — AB (ref 6.4–8.1)
Total Bilirubin: 0.8 mg/dl (ref 0.20–1.60)

## 2015-09-26 LAB — TECHNOLOGIST REVIEW CHCC SATELLITE

## 2015-09-26 LAB — PREPARE RBC (CROSSMATCH)

## 2015-09-26 LAB — ABO/RH: ABO/RH(D): A POS

## 2015-09-26 LAB — LACTATE DEHYDROGENASE: LDH: 518 U/L — AB (ref 125–245)

## 2015-09-26 MED ORDER — SODIUM CHLORIDE 0.9% FLUSH
10.0000 mL | INTRAVENOUS | Status: AC | PRN
  Administered 2015-09-26: 10 mL
  Filled 2015-09-26: qty 10

## 2015-09-26 MED ORDER — PROMETHAZINE HCL 25 MG/ML IJ SOLN
INTRAMUSCULAR | Status: AC
  Filled 2015-09-26: qty 1

## 2015-09-26 MED ORDER — HEPARIN SOD (PORK) LOCK FLUSH 100 UNIT/ML IV SOLN
500.0000 [IU] | Freq: Every day | INTRAVENOUS | Status: AC | PRN
  Administered 2015-09-26: 500 [IU]
  Filled 2015-09-26: qty 5

## 2015-09-26 MED ORDER — HYDROMORPHONE HCL 1 MG/ML IJ SOLN
2.0000 mg | INTRAMUSCULAR | Status: DC | PRN
  Administered 2015-09-26: 2 mg via INTRAVENOUS

## 2015-09-26 MED ORDER — SODIUM CHLORIDE 0.9 % IV SOLN
INTRAVENOUS | Status: DC
  Administered 2015-09-26: 10:00:00 via INTRAVENOUS

## 2015-09-26 MED ORDER — ACETAMINOPHEN 325 MG PO TABS
650.0000 mg | ORAL_TABLET | Freq: Once | ORAL | Status: AC
  Administered 2015-09-26: 650 mg via ORAL

## 2015-09-26 MED ORDER — HYDROMORPHONE HCL 1 MG/ML IJ SOLN
INTRAMUSCULAR | Status: AC
  Filled 2015-09-26: qty 2

## 2015-09-26 MED ORDER — SODIUM CHLORIDE 0.9 % IV SOLN
250.0000 mL | Freq: Once | INTRAVENOUS | Status: AC
  Administered 2015-09-26: 250 mL via INTRAVENOUS

## 2015-09-26 MED ORDER — PROMETHAZINE HCL 25 MG/ML IJ SOLN
12.5000 mg | Freq: Four times a day (QID) | INTRAMUSCULAR | Status: DC | PRN
Start: 2015-09-26 — End: 2015-09-26
  Administered 2015-09-26: 12.5 mg via INTRAVENOUS

## 2015-09-26 MED ORDER — LACTULOSE 20 GM/30ML PO SOLN: 15.0000 mL | Freq: Three times a day (TID) | ORAL | Status: AC

## 2015-09-26 MED ORDER — FUROSEMIDE 10 MG/ML IJ SOLN: 20.0000 mg | Freq: Once | INTRAMUSCULAR | Status: DC

## 2015-09-26 MED ORDER — ACETAMINOPHEN 325 MG PO TABS
ORAL_TABLET | ORAL | Status: AC
  Filled 2015-09-26: qty 2

## 2015-09-26 MED FILL — GENERLAC 10 GM/15 ML SOLN: 10 | 21 days supply | Qty: 946 | Fill #0

## 2015-09-26 NOTE — Care Management Note (Signed)
Case Management Note       LATE ENTRY             09/26/2015  Patient Details  Name: Kristen Ruiz MRN: [id number] Date of Birth: 06/16/1946  Subjective/Objective:             CM following for progression and d/c planning.       Action/Plan: 09/25/2015 received call from [facility] stating that they had been contacted by [place] to provide HHPT for this pt . They have the orders and other necessary info and will begin services on 09/26/2015.  They are contacting this pt.   Expected Discharge Date:    09/24/2015              Expected Discharge Plan:  [place]  In-House Referral:  NA  Discharge planning Services  CM Consult  Post Acute Care Choice:  Home Health Choice offered to:  Patient  DME Arranged:  N/A DME Agency:  NA  HH Arranged:  PT HH Agency:  [facility]  Status of Service:  Completed, signed off  Medicare Important Message Given:    Date Medicare IM Given:    Medicare IM give by:    Date Additional Medicare IM Given:    Additional Medicare Important Message give by:     If discussed at [place] of Stay Meetings, dates discussed:    Additional Comments:  Adron Bene, RN 09/26/2015, 10:15 AM

## 2015-09-26 NOTE — Patient Instructions (Signed)

## 2015-09-26 NOTE — Progress Notes (Signed)
Hematology and Oncology Follow Up Visit  Kristen Ruiz [id number] October 20, 1945 70 y.o. 09/26/2015   Principle Diagnosis:   Metastatic breast cancer with meningeal, hepatic, pulmonary metastasis-ER positive/HER-2 negative  Current Therapy:    Taxotere/Cytoxan-first 2 cycles given in [place]  Zometa 4 mg IV every 3-4 weeks     Interim History:  Kristen Ruiz is back for follow-up. We first saw her 2 weeks ago. She had moved down from [place]. She had metastatic breast cancer. She was being treated in [place]. She was on Cytoxan/Taxotere.  She was seen in the emergency room here because of severe hyperglycemia. This may have been from steroids that she was taking. She had meningeal metastasis..  Today, she is not doing too well. She comes in a wheelchair. She has a lot of abdominal distention. She has some discomfort. She feels as if she needs to go to the bathroom. She did have a bowel movement this morning.  Only first saw her, her CA 27.29 was 1135.  We adjusted her medications the first that we saw her. She still is unclear as to what she is to take. I went over her medicine list with both she and her husband.  We did have to give her some IV fluids in the office. We also gave her some blood. I thought that the blood would definitely help her feel a low bit better.  She has had no seizures. She's had no fever. She's had no leg swelling.  Overall, her performance status is ECOG 2.  Medications:  Current outpatient prescriptions:  .  dexamethasone (DECADRON) 4 MG tablet, Take 4 mg by mouth 2 (two) times daily with a meal., Disp: , Rfl:  .  dronabinol (MARINOL) 2.5 MG capsule, Take 1 capsule (2.5 mg total) by mouth 2 (two) times daily before lunch and supper., Disp: 60 capsule, Rfl: 3 .  metFORMIN (GLUCOPHAGE) 1000 MG tablet, Take 1 tablet (1,000 mg total) by mouth daily with breakfast., Disp: 30 tablet, Rfl: 2 .  ondansetron (ZOFRAN) 4 MG tablet, Take 4 mg by mouth every 8  (eight) hours as needed for nausea or vomiting., Disp: , Rfl:  .  OVER THE COUNTER MEDICATION, Take 2 capsules by mouth 2 (two) times daily. Reported on 09/25/2015, Disp: , Rfl:  .  Lactulose 20 GM/30ML SOLN, Take 15 mLs (10 g total) by mouth 3 (three) times daily., Disp: 1000 mL, Rfl: 4  Current facility-administered medications:  .  0.9 %  sodium chloride infusion, , Intravenous, Continuous, Volanda Napoleon, MD, Last Rate: 100 mL/hr at 09/26/15 0945 .  HYDROmorphone (DILAUDID) injection 2 mg, 2 mg, Intravenous, Q4H PRN, Volanda Napoleon, MD, 2 mg at 09/26/15 0958 .  promethazine (PHENERGAN) injection 12.5 mg, 12.5 mg, Intravenous, Q6H PRN, Volanda Napoleon, MD, 12.5 mg at 09/26/15 8115  Facility-Administered Medications Ordered in Other Visits:  .  0.9 %  sodium chloride infusion, 250 mL, Intravenous, Once, Volanda Napoleon, MD .  furosemide (LASIX) injection 20 mg, 20 mg, Intravenous, Once, Volanda Napoleon, MD, 20 mg at 09/26/15 1440 .  heparin lock flush 100 unit/mL, 500 Units, Intracatheter, Daily PRN, Volanda Napoleon, MD .  sodium chloride flush (NS) 0.9 % injection 10 mL, 10 mL, Intracatheter, PRN, Volanda Napoleon, MD  Allergies: No Known Allergies  Past Medical History, Surgical history, Social history, and Family History were reviewed and updated.  Review of Systems: As above  Physical Exam:  height is _0  (1.651 m). Her oral  temperature is 97.5 F (36.4 C). Her blood pressure is 105/63 and her pulse is 87. Her respiration is 16.   Wt Readings from Last 3 Encounters:  09/22/15 184 lb (83.462 kg)  09/11/15 203 lb (92.08 kg)     Chronically ill-appearing African-American female in no obvious distress. Head and neck exam shows no ocular or oral lesions. She has no thrush. She has no adenopathy on her neck. Lungs are clear bilaterally. Cardiac exam is slightly tachycardic regular. She has no murmurs, rubs or bruits. Abdomen is soft. She has some slight abdominal distention. She has  decent bowel sounds. There is no guarding or rebound tenderness. Back exam shows no tenderness over the spine, ribs or hips. Extremities shows no clubbing, cyanosis or edema. Skin exam is slightly dry. Neurological exam shows no focal neurological deficits.  Lab Results  Component Value Date   WBC 7.2 Corrected for nRBC 09/26/2015   HGB 8.4* 09/26/2015   HCT 26.6* 09/26/2015   MCV 95 09/26/2015   PLT 176 09/26/2015     Chemistry      Component Value Date/Time   NA 140 09/26/2015 0824   NA 137 09/24/2015 0430   NA 134* 09/11/2015 0838   K 4.3 09/26/2015 0824   K 4.5 09/24/2015 0430   K 4.4 09/11/2015 0838   CL 104 09/26/2015 0824   CL 110 09/24/2015 0430   CO2 23 09/26/2015 0824   CO2 22 09/24/2015 0430   CO2 20* 09/11/2015 0838   BUN 11 09/26/2015 0824   BUN 16 09/24/2015 0430   BUN 33.0* 09/11/2015 0838   CREATININE 0.5* 09/26/2015 0824   CREATININE 0.50 09/24/2015 0430   CREATININE 1.0 09/11/2015 0838      Component Value Date/Time   CALCIUM 8.4 09/26/2015 0824   CALCIUM 7.7* 09/24/2015 0430   CALCIUM 9.2 09/11/2015 0838   ALKPHOS 84 09/26/2015 0824   ALKPHOS 68 09/23/2015 0415   ALKPHOS 96 09/11/2015 0838   AST 55* 09/26/2015 0824   AST 66* 09/23/2015 0415   AST 37* 09/11/2015 0838   ALT 59* 09/26/2015 0824   ALT 51 09/23/2015 0415   ALT 75* 09/11/2015 0838   BILITOT 0.80 09/26/2015 0824   BILITOT 0.9 09/23/2015 0415   BILITOT 0.72 09/11/2015 0838         Impression and Plan: Kristen Ruiz is a 70 year old African American female. She has metastatic breast cancer. She is HER-2 negative. She has been through hormonal manipulations. She now is on systemic chemotherapy.  She was due for her there cycle of treatment today. However, I still think that she would've taken it. I think she would've had complications from it.  As such, we gave her IV fluids. We gave her IV antibiotics. She got 2 units of blood. At the end of the day, she felt a whole lot  better.  Again, I went over her medications with both she and her husband. I did give her a prescription for lactulose to see if this would help with her constipation.  We spent over an hour with her. She just was very complicated today. She had a lot of issues going on. Per a thankfully, her blood sugars are much better.  The outcome is certainly not favorable by any means. Her elevated CA 27.29 is quite high and I think this is indicative of the degree of tumor burden that she has.  We may have to consider rescanning her some point. I may have to consider doing another  biopsy so we can look at the HER-2 status.  I would like to try to get her started on treatment again next week. We'll have her see Judson Roch.   Volanda Napoleon, MD 1/25/20174:40 PM

## 2015-09-27 ENCOUNTER — Ambulatory Visit: Payer: Self-pay

## 2015-09-27 ENCOUNTER — Encounter: Payer: Self-pay | Admitting: Hematology & Oncology

## 2015-09-27 LAB — TYPE AND SCREEN
ABO/RH(D): A POS
ANTIBODY SCREEN: NEGATIVE
UNIT DIVISION: 0
UNIT DIVISION: 0

## 2015-09-27 NOTE — Telephone Encounter (Signed)
Pt was no show for new pt/hospital follow up 09/26/15 11:15am, called pt to reschedule, voicemail full, charge or no charge?

## 2015-09-28 NOTE — Telephone Encounter (Signed)
No charge.  Please mail patient a no show letter requesting that she call to reschedule. Thanks.

## 2015-10-01 ENCOUNTER — Encounter: Payer: Self-pay | Admitting: Family

## 2015-10-02 ENCOUNTER — Ambulatory Visit: Payer: Medicare Other | Admitting: Family

## 2015-10-02 ENCOUNTER — Ambulatory Visit: Payer: Medicare Other

## 2015-10-02 ENCOUNTER — Other Ambulatory Visit: Payer: Medicare Other

## 2015-10-02 ENCOUNTER — Telehealth: Payer: Self-pay | Admitting: Hematology & Oncology

## 2015-10-02 NOTE — Telephone Encounter (Signed)
Patient's sister Abigail Butts called and cx 10/02/15 and 10/04/15 apts and resch for 10/09/15 and 10/11/15

## 2015-10-04 ENCOUNTER — Ambulatory Visit: Payer: Medicare Other

## 2015-10-09 ENCOUNTER — Other Ambulatory Visit: Payer: Self-pay | Admitting: *Deleted

## 2015-10-09 ENCOUNTER — Other Ambulatory Visit (HOSPITAL_BASED_OUTPATIENT_CLINIC_OR_DEPARTMENT_OTHER): Payer: Medicare Other

## 2015-10-09 ENCOUNTER — Ambulatory Visit (HOSPITAL_BASED_OUTPATIENT_CLINIC_OR_DEPARTMENT_OTHER): Payer: Medicare Other | Admitting: Family

## 2015-10-09 ENCOUNTER — Ambulatory Visit (HOSPITAL_BASED_OUTPATIENT_CLINIC_OR_DEPARTMENT_OTHER)
Admission: RE | Admit: 2015-10-09 | Discharge: 2015-10-09 | Disposition: A | Discharge status: Home / Self Care | Payer: Managed Care, Other (non HMO) | Source: Ambulatory Visit | Attending: Hematology & Oncology | Admitting: Hematology & Oncology

## 2015-10-09 ENCOUNTER — Ambulatory Visit (HOSPITAL_COMMUNITY)
Admission: RE | Admit: 2015-10-09 | Discharge: 2015-10-09 | Disposition: A | Discharge status: Home / Self Care | Payer: Managed Care, Other (non HMO) | Source: Ambulatory Visit | Attending: Hematology & Oncology | Admitting: Hematology & Oncology

## 2015-10-09 ENCOUNTER — Ambulatory Visit (HOSPITAL_BASED_OUTPATIENT_CLINIC_OR_DEPARTMENT_OTHER): Payer: Managed Care, Other (non HMO)

## 2015-10-09 ENCOUNTER — Other Ambulatory Visit (HOSPITAL_BASED_OUTPATIENT_CLINIC_OR_DEPARTMENT_OTHER): Payer: Managed Care, Other (non HMO)

## 2015-10-09 VITALS — BP 114/63 | HR 127 | Temp 97.6°F | Resp 20

## 2015-10-09 VITALS — BP 116/63 | HR 74 | Temp 98.1°F | Resp 20

## 2015-10-09 DIAGNOSIS — C50919 Malignant neoplasm of unspecified site of unspecified female breast: Secondary | ICD-10-CM

## 2015-10-09 DIAGNOSIS — D630 Anemia in neoplastic disease: Secondary | ICD-10-CM

## 2015-10-09 DIAGNOSIS — J90 Pleural effusion, not elsewhere classified: Secondary | ICD-10-CM | POA: Diagnosis not present

## 2015-10-09 DIAGNOSIS — C7800 Secondary malignant neoplasm of unspecified lung: Secondary | ICD-10-CM | POA: Insufficient documentation

## 2015-10-09 DIAGNOSIS — D649 Anemia, unspecified: Secondary | ICD-10-CM | POA: Diagnosis not present

## 2015-10-09 DIAGNOSIS — C787 Secondary malignant neoplasm of liver and intrahepatic bile duct: Secondary | ICD-10-CM | POA: Insufficient documentation

## 2015-10-09 DIAGNOSIS — I7090 Unspecified atherosclerosis: Secondary | ICD-10-CM | POA: Insufficient documentation

## 2015-10-09 DIAGNOSIS — R630 Anorexia: Secondary | ICD-10-CM

## 2015-10-09 DIAGNOSIS — Z170 Estrogen receptor positive status [ER+]: Secondary | ICD-10-CM

## 2015-10-09 DIAGNOSIS — R918 Other nonspecific abnormal finding of lung field: Secondary | ICD-10-CM | POA: Insufficient documentation

## 2015-10-09 DIAGNOSIS — R109 Unspecified abdominal pain: Secondary | ICD-10-CM | POA: Diagnosis not present

## 2015-10-09 DIAGNOSIS — R590 Localized enlarged lymph nodes: Secondary | ICD-10-CM | POA: Diagnosis not present

## 2015-10-09 DIAGNOSIS — R739 Hyperglycemia, unspecified: Secondary | ICD-10-CM

## 2015-10-09 DIAGNOSIS — C7949 Secondary malignant neoplasm of other parts of nervous system: Secondary | ICD-10-CM

## 2015-10-09 DIAGNOSIS — K8020 Calculus of gallbladder without cholecystitis without obstruction: Secondary | ICD-10-CM | POA: Diagnosis not present

## 2015-10-09 DIAGNOSIS — C7951 Secondary malignant neoplasm of bone: Secondary | ICD-10-CM | POA: Insufficient documentation

## 2015-10-09 DIAGNOSIS — K5730 Diverticulosis of large intestine without perforation or abscess without bleeding: Secondary | ICD-10-CM | POA: Diagnosis not present

## 2015-10-09 DIAGNOSIS — R112 Nausea with vomiting, unspecified: Secondary | ICD-10-CM

## 2015-10-09 DIAGNOSIS — K5903 Drug induced constipation: Secondary | ICD-10-CM

## 2015-10-09 DIAGNOSIS — R0602 Shortness of breath: Secondary | ICD-10-CM | POA: Diagnosis not present

## 2015-10-09 DIAGNOSIS — T380X5A Adverse effect of glucocorticoids and synthetic analogues, initial encounter: Secondary | ICD-10-CM

## 2015-10-09 LAB — CMP (CANCER CENTER ONLY)
ALBUMIN: 3.2 g/dL — AB (ref 3.3–5.5)
ALT(SGPT): 74 U/L — ABNORMAL HIGH (ref 10–47)
AST: 64 U/L — AB (ref 11–38)
Alkaline Phosphatase: 122 U/L — ABNORMAL HIGH (ref 26–84)
BILIRUBIN TOTAL: 1 mg/dL (ref 0.20–1.60)
BUN: 18 mg/dL (ref 7–22)
CO2: 17 meq/L — AB (ref 18–33)
CREATININE: 0.8 mg/dL (ref 0.6–1.2)
Calcium: 9.7 mg/dL (ref 8.0–10.3)
Chloride: 104 mEq/L (ref 98–108)
Glucose, Bld: 229 mg/dL — ABNORMAL HIGH (ref 73–118)
Potassium: 3.6 mEq/L (ref 3.3–4.7)
SODIUM: 137 meq/L (ref 128–145)
TOTAL PROTEIN: 5.9 g/dL — AB (ref 6.4–8.1)

## 2015-10-09 LAB — CBC WITH DIFFERENTIAL (CANCER CENTER ONLY)
BASO#: 0 10*3/uL (ref 0.0–0.2)
BASO%: 0.2 % (ref 0.0–2.0)
EOS%: 0.1 % (ref 0.0–7.0)
Eosinophils Absolute: 0 10*3/uL (ref 0.0–0.5)
HCT: 24.6 % — ABNORMAL LOW (ref 34.8–46.6)
HEMOGLOBIN: 7.4 g/dL — AB (ref 11.6–15.9)
LYMPH#: 6.3 10*3/uL — ABNORMAL HIGH (ref 0.9–3.3)
LYMPH%: 49.6 % — ABNORMAL HIGH (ref 14.0–48.0)
MCH: 28.9 pg (ref 26.0–34.0)
MCHC: 30.1 g/dL — AB (ref 32.0–36.0)
MCV: 96 fL (ref 81–101)
MONO#: 0.8 10*3/uL (ref 0.1–0.9)
MONO%: 6 % (ref 0.0–13.0)
NEUT%: 44.1 % (ref 39.6–80.0)
NEUTROS ABS: 5.6 10*3/uL (ref 1.5–6.5)
Platelets: 248 10*3/uL (ref 145–400)
RBC: 2.56 10*6/uL — AB (ref 3.70–5.32)
RDW: 20.3 % — ABNORMAL HIGH (ref 11.1–15.7)

## 2015-10-09 LAB — LACTATE DEHYDROGENASE: LDH: 536 U/L — ABNORMAL HIGH (ref 125–245)

## 2015-10-09 LAB — TECHNOLOGIST REVIEW CHCC SATELLITE

## 2015-10-09 LAB — PREPARE RBC (CROSSMATCH)

## 2015-10-09 MED ORDER — HEPARIN SOD (PORK) LOCK FLUSH 100 UNIT/ML IV SOLN
250.0000 [IU] | INTRAVENOUS | Status: AC | PRN
  Administered 2015-10-09: 500 [IU]
  Filled 2015-10-09: qty 5

## 2015-10-09 MED ORDER — SODIUM CHLORIDE 0.9 % IV SOLN
250.0000 mL | Freq: Once | INTRAVENOUS | Status: AC
  Administered 2015-10-09: 250 mL via INTRAVENOUS

## 2015-10-09 MED ORDER — FUROSEMIDE 10 MG/ML IJ SOLN: 20.0000 mg | Freq: Once | INTRAMUSCULAR | Status: DC

## 2015-10-09 MED ORDER — DIPHENHYDRAMINE HCL 25 MG PO CAPS
25.0000 mg | ORAL_CAPSULE | Freq: Once | ORAL | Status: AC
  Administered 2015-10-09: 25 mg via ORAL

## 2015-10-09 MED ORDER — ACETAMINOPHEN 325 MG PO TABS
650.0000 mg | ORAL_TABLET | Freq: Once | ORAL | Status: AC
  Administered 2015-10-09: 650 mg via ORAL

## 2015-10-09 MED ORDER — SODIUM CHLORIDE 0.9% FLUSH
3.0000 mL | INTRAVENOUS | Status: AC | PRN
  Administered 2015-10-09: 10 mL
  Filled 2015-10-09: qty 10

## 2015-10-09 MED ORDER — DIPHENHYDRAMINE HCL 25 MG PO CAPS
ORAL_CAPSULE | ORAL | Status: AC
  Filled 2015-10-09: qty 1

## 2015-10-09 MED ORDER — IOHEXOL 300 MG/ML  SOLN: 100.0000 mL | Freq: Once | INTRAMUSCULAR | Status: DC | PRN

## 2015-10-09 MED ORDER — ACETAMINOPHEN 325 MG PO TABS
ORAL_TABLET | ORAL | Status: AC
  Filled 2015-10-09: qty 2

## 2015-10-09 MED ORDER — DRONABINOL 5 MG PO CAPS: 5.0000 mg | ORAL_CAPSULE | Freq: Two times a day (BID) | ORAL | Status: AC

## 2015-10-09 NOTE — Progress Notes (Signed)
Hematology and Oncology Follow Up Visit  Kristen Ruiz [id number] [birth year] 70 y.o. 10/09/2015   Principle Diagnosis:  Metastatic breast cancer with meningeal, hepatic, pulmonary metastasis-ER positive/HER-2 negative  Current Therapy:   Taxotere/Cytoxan - first 2 cycles given in [place] Zometa 4 mg IV every 3-4 weeks    Interim History:  Kristen Ruiz is here today for a follow-up and treatment. She is doing quite poorly. She c/o fatigue and weakness. She has some right sided tenderness on palpation.  She states that she has been taking her lactulose daily and that her last BM was last week. She feels like she needs to "pass gas" but has not today.  She has had nausea with vomiting. She states that she has vomited twice this morning.  She has not had much of an appetite but has tried to eat and drink. She states that she does feel dehydrated.  Her Hgb today is 7.4 with an MCV of 96. Her WBC count is 11.3.  No fever, chills, cough, rash, chest pain, palpitations, abdominal pain or changes in bladder habits.  She has some SOB with exertion.  She has had some dizziness at times. No falls or syncopal episodes.  No swelling, tenderness, numbness or tingling. No c/o joint or "bone" pain.    Medications:    Medication List       This list is accurate as of: 10/09/15  2:50 PM.  Always use your most recent med list.               dexamethasone 4 MG tablet  Commonly known as:  DECADRON  Take 4 mg by mouth 2 (two) times daily with a meal.     dronabinol 2.5 MG capsule  Commonly known as:  MARINOL  Take 1 capsule (2.5 mg total) by mouth 2 (two) times daily before lunch and supper.     Lactulose 20 GM/30ML Soln  Take 15 mLs (10 g total) by mouth 3 (three) times daily.     metFORMIN 1000 MG tablet  Commonly known as:  GLUCOPHAGE  Take 1 tablet (1,000 mg total) by mouth daily with breakfast.     ondansetron 4 MG tablet  Commonly known as:  ZOFRAN  Take 4 mg by mouth every 8 (eight)  hours as needed for nausea or vomiting.     OVER THE COUNTER MEDICATION  Take 2 capsules by mouth 2 (two) times daily. Reported on 09/25/2015        Allergies: No Known Allergies  Past Medical History, Surgical history, Social history, and Family History were reviewed and updated.  Review of Systems: All other 10 point review of systems is negative.   Physical Exam:  oral temperature is 97.6 F (36.4 C). Her blood pressure is 114/63 and her pulse is 127. Her respiration is 20.   Wt Readings from Last 3 Encounters:  09/22/15 184 lb (83.462 kg)  09/11/15 203 lb (92.08 kg)    Ocular: Sclerae unicteric, pupils equal, round and reactive to light Ear-nose-throat: Oropharynx clear, dentition fair Lymphatic: No cervical supraclavicular or axillary adenopathy Lungs no rales or rhonchi, good excursion bilaterally Heart regular rate and rhythm, no murmur appreciated Abd soft, nontender, positive bowel sounds, no liver or spleen tip palpated on exam MSK no focal spinal tenderness, no joint edema Neuro: non-focal, well-oriented, appropriate affect Breasts: Deferred  Lab Results  Component Value Date   WBC 11.3 Corrected for nRBC* 10/09/2015   HGB 7.4* 10/09/2015   HCT 24.6* 10/09/2015  MCV 96 10/09/2015   PLT 248 10/09/2015   No results found for: FERRITIN, IRON, TIBC, UIBC, IRONPCTSAT Lab Results  Component Value Date   RBC 2.56* 10/09/2015   No results found for: KPAFRELGTCHN, LAMBDASER, KAPLAMBRATIO No results found for: IGGSERUM, IGA, IGMSERUM No results found for: Kathrynn Ducking, MSPIKE, SPEI   Chemistry      Component Value Date/Time   NA 137 10/09/2015 0918   NA 137 09/24/2015 0430   NA 134* 09/11/2015 0838   K 3.6 10/09/2015 0918   K 4.5 09/24/2015 0430   K 4.4 09/11/2015 0838   CL 104 10/09/2015 0918   CL 110 09/24/2015 0430   CO2 17* 10/09/2015 0918   CO2 22 09/24/2015 0430   CO2 20* 09/11/2015 0838   BUN 18  10/09/2015 0918   BUN 16 09/24/2015 0430   BUN 33.0* 09/11/2015 0838   CREATININE 0.8 10/09/2015 0918   CREATININE 0.50 09/24/2015 0430   CREATININE 1.0 09/11/2015 0838      Component Value Date/Time   CALCIUM 9.7 10/09/2015 0918   CALCIUM 7.7* 09/24/2015 0430   CALCIUM 9.2 09/11/2015 0838   ALKPHOS 122* 10/09/2015 0918   ALKPHOS 68 09/23/2015 0415   ALKPHOS 96 09/11/2015 0838   AST 64* 10/09/2015 0918   AST 66* 09/23/2015 0415   AST 37* 09/11/2015 0838   ALT 74* 10/09/2015 0918   ALT 51 09/23/2015 0415   ALT 75* 09/11/2015 0838   BILITOT 1.00 10/09/2015 0918   BILITOT 0.9 09/23/2015 0415   BILITOT 0.72 09/11/2015 0838     Impression and Plan: Kristen Ruiz is a 70 yo African American female with metastatic breast cancer. She is HER-2 negative. She has been through hormonal manipulations and is now on systemic chemotherapy. She received her first 2 cycles while still living in [place]. Unfortunately, We have not been able to treat her here yet due to her feeling so poorly. She is back again today and in the bedroom. She is fatigued, weak, nauseated and vomiting as well as having right sided tenderness.  Her Hgb is down at 7.4 and a WBC count of 11.3. CA 27.29 is pending.  We will go ahead and give her fluids today and also 1 unit of blood.  She will go down for a CT of the chest, abdomen and pelvis this afternoon. We will hold off on treating her today.     This was a shared visit with Dr. Marin Olp and he is agreement with the aforementioned.   Eliezer Bottom, NP 2/7/20172:50 PM    Addendum:  I saw and examined the patient. I spoke to her sister at length. I spent probably an hour and half with her. Her sister, who lives in [place], which is not aware as to how much Kristen Ruiz had declined.  Since Kristen Ruiz has been with this, we will be able to give her any therapy because she has been sensitive difficult shape whenever she comes in to see [place].  I have to believe that  her cancer is involving her bone marrow. Her hemoglobin is dropping. Her blood smear shows some immature myeloids cells patient has nucleated red cells. I think these are all consistent with marrow involvement.  She will progressive from oxygen at home. We did go ahead and test her. Her oxygen saturation with minimal exertion was 87%. We put smock soon on her, it came up to 93%. I think this would be very helpful.  We  did do a CT scan of her chest/abdomen/pelvis. She has numerous pulmonary metastasis. She has numerous hepatic metastasis. She has widespread bony disease.  Her last CA 27 was 975. Prior was 1135.  She is not eating. We'll try her on different things to get her to eat. She just does not have much of a taste for food. I did have her on some Marinol. We can increase the Marinol to 5 mg twice a day. I told her to rinse out her mouth with water and baking soda. She will try this along with some Biotene  She is not complaining too much in the way of pain.  Her blood sugars. Having difficult to control. I'm not sure she is seeing her primary care doctor for this.  Her prealbumin is 24. This actually is a little better than I would've thought.  When the problems is that she has meningeal disease. We have not been able to assess this. We will have to see about getting an MRI on her to see if this has progressed.  I really don't think that she is a candidate for aggressive chemotherapy. Her performance status, at best, is ECOG 3.  She does not wish to be kept alive on machines. She made a perfectly clear with her sister in the room. As such, she is a NO CODE BLUE.  Hopefully, the one unit of blood will help her.  We will have to try to get her back in next week. It sounds like she is having more difficulties getting to the office. It sounds like she may need to get EMS to bring her in.  Her sister is very nice. Her sister is very knowledgeable. We talked at length as to how we try to  help Kristen Ruiz. Ultimately, we may need hospice to be involved, particularly freer not going to be able to treat her.  I wanted to try to get her in this Friday but Kristen Ruiz says she was not going to be oh to make it.  We will try for next Tuesday or maybe Monday.  I will be in touch with her sister regarding results of the scan.  I think that the oxygen at home will help.  There may be a component of depression. We can try Remeron as this can help with the appetite. Her sister wanted to hold off on this for right now.  Lum Keas

## 2015-10-09 NOTE — Progress Notes (Addendum)
PAtient O2 Saturation dropped to 88% on RA after ambulation.  Placed O2 at 2 L N/C on patient and O2 sat increased to 93%.     _88_% on RA at rest  _86_% on RA with ambulation  __93% on _2_L O2 with ambulation

## 2015-10-09 NOTE — Patient Instructions (Signed)

## 2015-10-10 ENCOUNTER — Other Ambulatory Visit: Payer: Self-pay

## 2015-10-10 ENCOUNTER — Ambulatory Visit: Payer: Self-pay | Admitting: Hematology & Oncology

## 2015-10-10 ENCOUNTER — Ambulatory Visit: Payer: Self-pay

## 2015-10-10 LAB — PREALBUMIN: PREALBUMIN: 24 mg/dL (ref 10–36)

## 2015-10-10 LAB — TYPE AND SCREEN
ABO/RH(D): A POS
Antibody Screen: NEGATIVE
UNIT DIVISION: 0

## 2015-10-10 LAB — CANCER ANTIGEN 27.29: CAN 27.29: 975.3 U/mL — AB (ref 0.0–38.6)

## 2015-10-11 ENCOUNTER — Ambulatory Visit: Payer: Medicare Other

## 2015-10-11 ENCOUNTER — Ambulatory Visit: Payer: Self-pay

## 2015-10-11 ENCOUNTER — Other Ambulatory Visit: Payer: Medicare Other

## 2015-10-11 ENCOUNTER — Telehealth: Payer: Self-pay | Admitting: *Deleted

## 2015-10-11 ENCOUNTER — Ambulatory Visit: Payer: Medicare Other | Admitting: Family

## 2015-10-11 ENCOUNTER — Encounter: Payer: Self-pay | Admitting: *Deleted

## 2015-10-11 NOTE — Telephone Encounter (Signed)
Spoke Amy at [facility] and made the referral for this patient. Requested that they speak to patient's sister, Sherri Rad, before making contact with the patient. Number, [phone], given.

## 2015-10-15 ENCOUNTER — Telehealth: Payer: Self-pay | Admitting: Emergency Medicine

## 2015-10-15 MED FILL — PROCHLORPERAZINE 10 MG TAB: 10 | 5 days supply | Qty: 30 | Fill #0

## 2015-10-15 NOTE — Telephone Encounter (Signed)
Spoke with Patty at hospice; informed her per Dr Marin Olp that patient may D/C Metformin. Patty verbalized understanding and was appreciative of the phone call back.

## 2015-10-17 ENCOUNTER — Ambulatory Visit: Payer: Self-pay | Admitting: Family

## 2015-10-18 MED FILL — DEXAMETHASONE 4 MG TABLET: 4 | 30 days supply | Qty: 60 | Fill #1

## 2015-10-19 ENCOUNTER — Telehealth: Payer: Self-pay | Admitting: *Deleted

## 2015-10-19 NOTE — Telephone Encounter (Signed)
Abigail Butts informing the office that patient continues to decline. She would like to cancel the appointment for next Thursday and would like Hospice to draw the lab work on Tuesday when they come to the home.   Spoke to McK[place] and she will draw labs on Tuesday. Orders will be faxed. Dr Marin Olp notified. Appointments cancelled.

## 2015-10-22 ENCOUNTER — Other Ambulatory Visit: Payer: Medicare Other

## 2015-10-22 ENCOUNTER — Ambulatory Visit: Payer: Medicare Other

## 2015-10-22 ENCOUNTER — Ambulatory Visit: Payer: Medicare Other | Admitting: Hematology & Oncology

## 2015-10-24 ENCOUNTER — Telehealth: Payer: Self-pay | Admitting: *Deleted

## 2015-10-24 ENCOUNTER — Encounter: Payer: Self-pay | Admitting: Hematology & Oncology

## 2015-10-24 ENCOUNTER — Ambulatory Visit: Payer: Medicare Other

## 2015-10-24 ENCOUNTER — Ambulatory Visit: Payer: Self-pay | Admitting: Hematology & Oncology

## 2015-10-24 ENCOUNTER — Other Ambulatory Visit: Payer: Self-pay

## 2015-10-24 ENCOUNTER — Ambulatory Visit: Payer: Self-pay

## 2015-10-24 ENCOUNTER — Other Ambulatory Visit: Payer: Self-pay | Admitting: *Deleted

## 2015-10-24 DIAGNOSIS — R52 Pain, unspecified: Secondary | ICD-10-CM

## 2015-10-24 MED ORDER — MORPHINE SULFATE (CONCENTRATE) 20 MG/ML PO SOLN: 5.0000 mg | ORAL | Status: AC | PRN

## 2015-10-24 MED FILL — MORPHINE SULF 100 MG/5 ML S: 100 | 15 days supply | Qty: 30 | Fill #0

## 2015-10-24 NOTE — Telephone Encounter (Signed)
Received call from Navos, patient's Hospice RN. She states patient had a near syncopal episode this morning. She is now at the home and patient's pulse is 133 and BP 78/50. Patient states she is ready to die and doesn't want any intervention. She wants to stay at home. Hospice RN suggests send a prescription for Roxanol for pain in case patient needs it.  Spoke to Dr Marin Olp and notified him of update. He's aware and will write script for pain meds.

## 2015-10-25 ENCOUNTER — Encounter: Payer: Self-pay | Admitting: Hematology & Oncology

## 2015-10-25 ENCOUNTER — Ambulatory Visit: Payer: Self-pay

## 2015-10-25 ENCOUNTER — Ambulatory Visit: Payer: Medicare Other | Admitting: Hematology & Oncology

## 2015-10-25 ENCOUNTER — Other Ambulatory Visit: Payer: Medicare Other

## 2015-10-25 ENCOUNTER — Ambulatory Visit: Payer: Medicare Other

## 2015-10-26 ENCOUNTER — Encounter: Payer: Self-pay | Admitting: Hematology & Oncology

## 2015-10-29 ENCOUNTER — Telehealth: Payer: Self-pay | Admitting: *Deleted

## 2015-10-29 NOTE — Telephone Encounter (Signed)
Notified that patient passed at home on 12-Nov-2015 at [place]  Dr Marin Olp notified.

## 2015-10-31 DEATH — deceased

## 2015-11-07 ENCOUNTER — Ambulatory Visit: Payer: Self-pay | Admitting: Hematology & Oncology

## 2015-11-07 ENCOUNTER — Other Ambulatory Visit: Payer: Self-pay

## 2015-11-07 ENCOUNTER — Ambulatory Visit: Payer: Self-pay

## 2015-11-08 ENCOUNTER — Ambulatory Visit: Payer: Self-pay

## 2015-11-12 ENCOUNTER — Ambulatory Visit: Payer: Medicare Other | Admitting: Hematology & Oncology

## 2015-11-12 ENCOUNTER — Ambulatory Visit: Payer: Medicare Other

## 2015-11-12 ENCOUNTER — Other Ambulatory Visit: Payer: Medicare Other

## 2015-11-14 ENCOUNTER — Ambulatory Visit: Payer: Medicare Other

## 2015-12-03 ENCOUNTER — Other Ambulatory Visit: Payer: Medicare Other

## 2015-12-03 ENCOUNTER — Ambulatory Visit: Payer: Medicare Other

## 2015-12-03 ENCOUNTER — Ambulatory Visit: Payer: Medicare Other | Admitting: Hematology & Oncology

## 2015-12-05 ENCOUNTER — Ambulatory Visit: Payer: Medicare Other

## 2016-03-04 ENCOUNTER — Other Ambulatory Visit: Payer: Self-pay | Admitting: Nurse Practitioner

## 2017-01-04 IMAGING — DX DG ABDOMEN 2V
2 series · 2 of 2 positions shown · non-contrast
Comparison: None.

CLINICAL DATA: Abdominal pain, nausea, vomiting

EXAM:
ABDOMEN - 2 VIEW

[w abdomen decub]
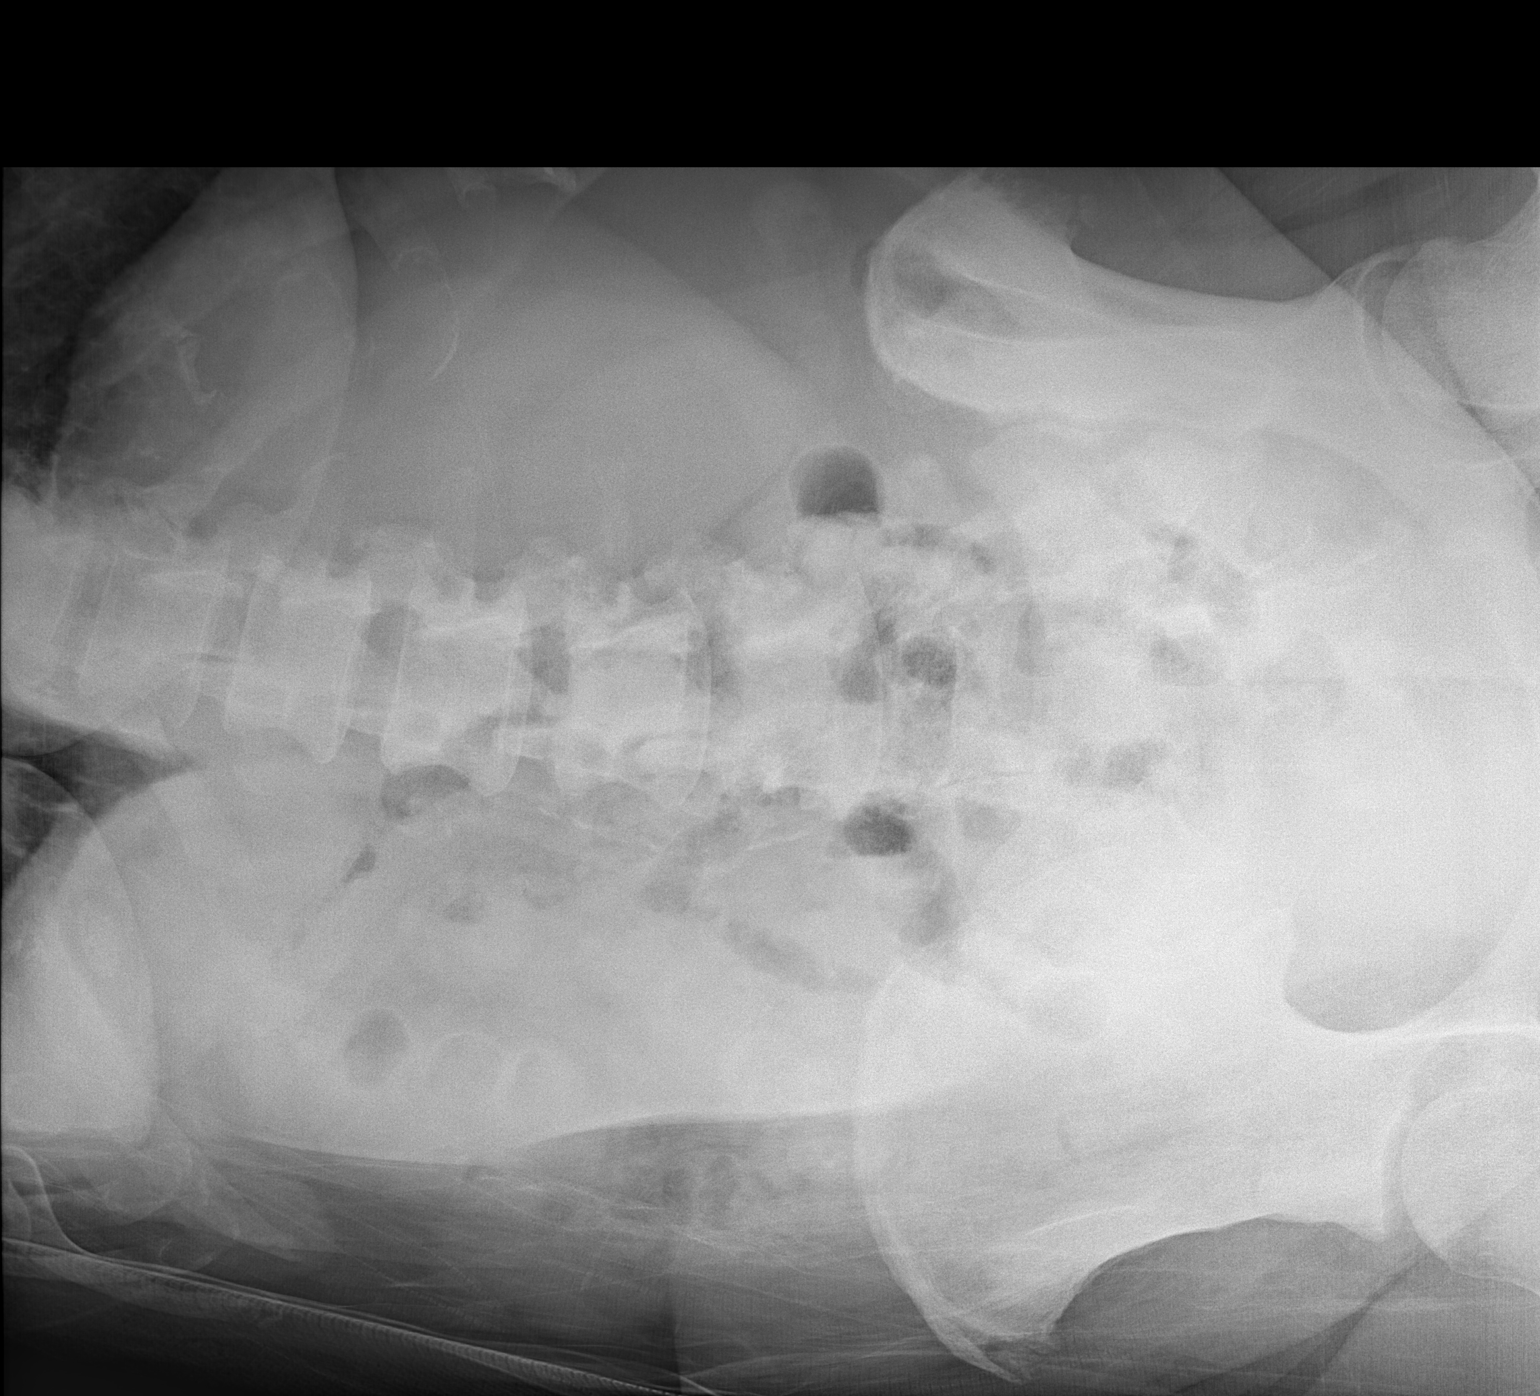

[x abdomen supine]
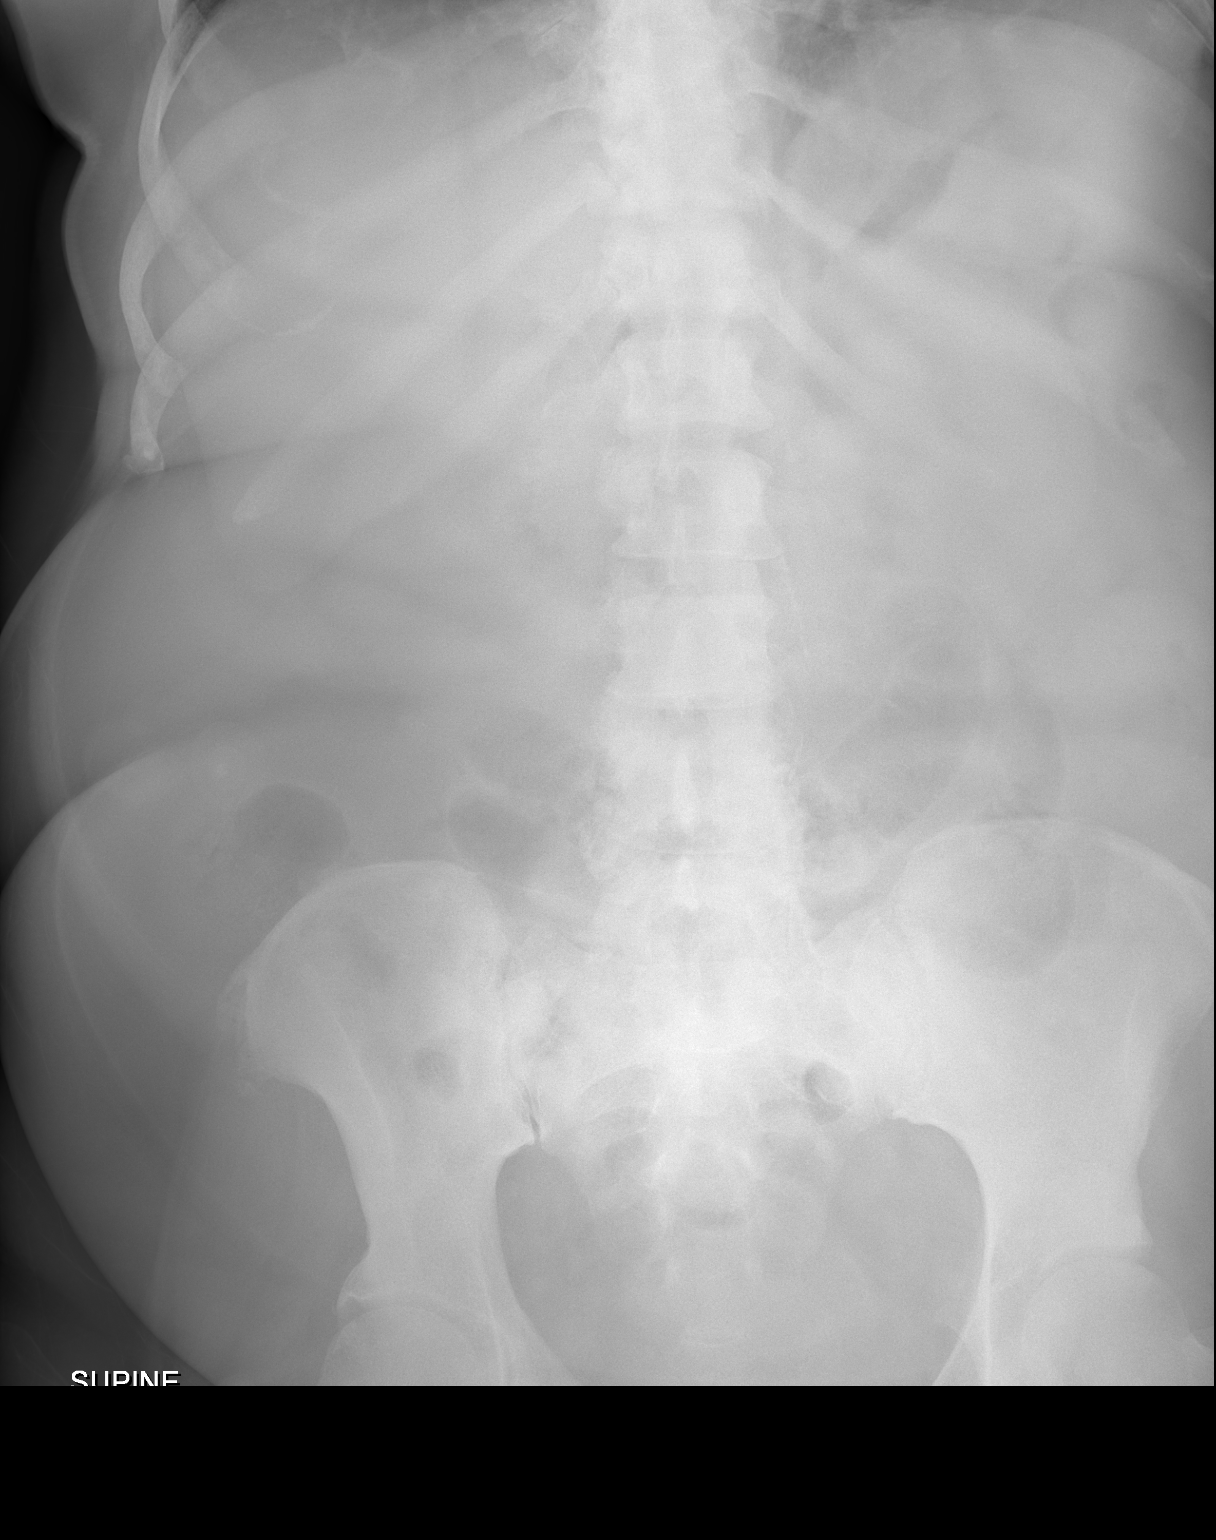

[2 of 2 positions shown; findings below may reference images not displayed]

FINDINGS: The bowel gas pattern is normal. There is no evidence of free air.
No radio-opaque calculi or other significant radiographic
abnormality is seen.
IMPRESSION: Negative.

## 2017-01-04 IMAGING — CR DG CHEST 2V
2 series · 2 of 2 positions shown · non-contrast
Comparison: None.

CLINICAL DATA: Shortness of Breath

EXAM:
CHEST - 2 VIEW

[chest lat]
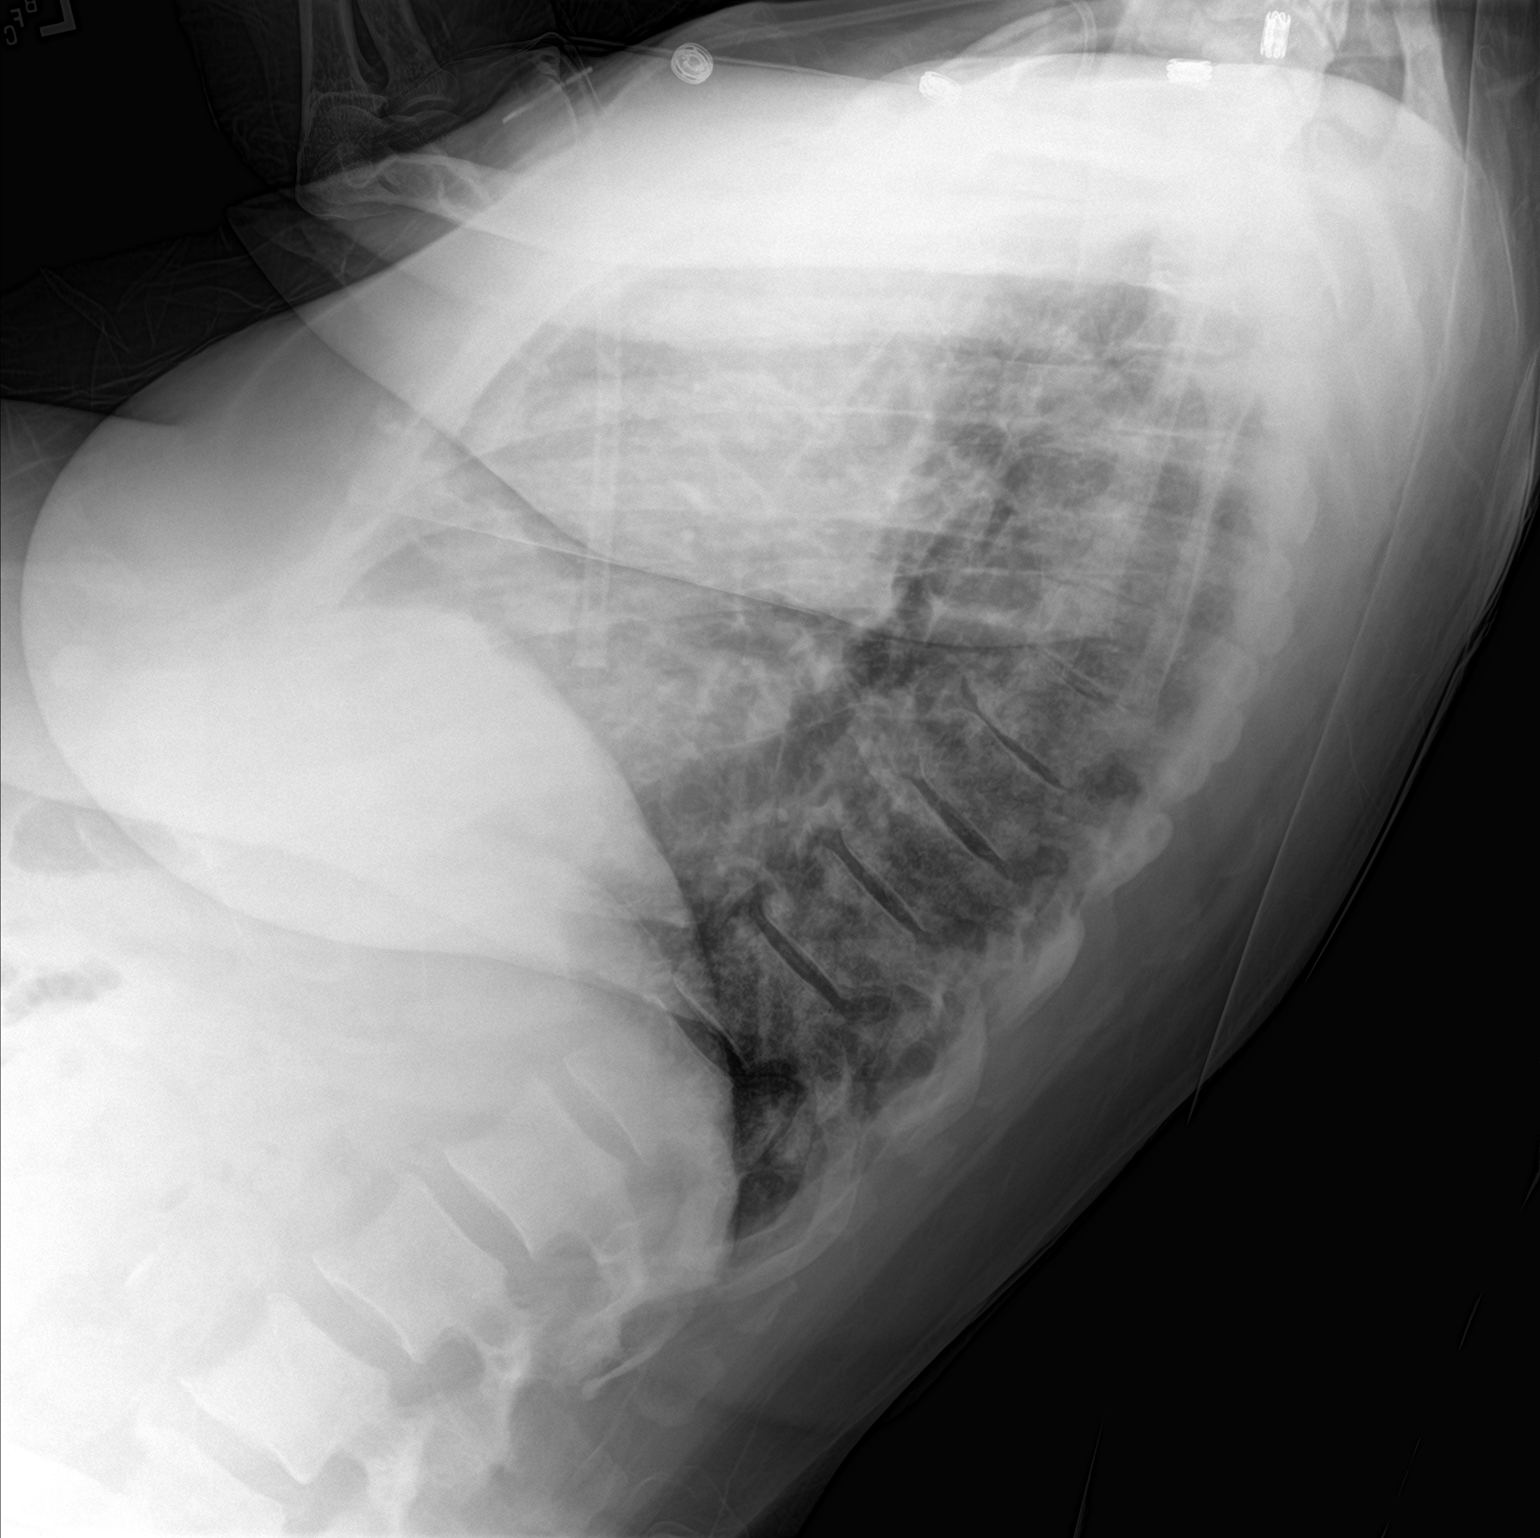

[chest ap]
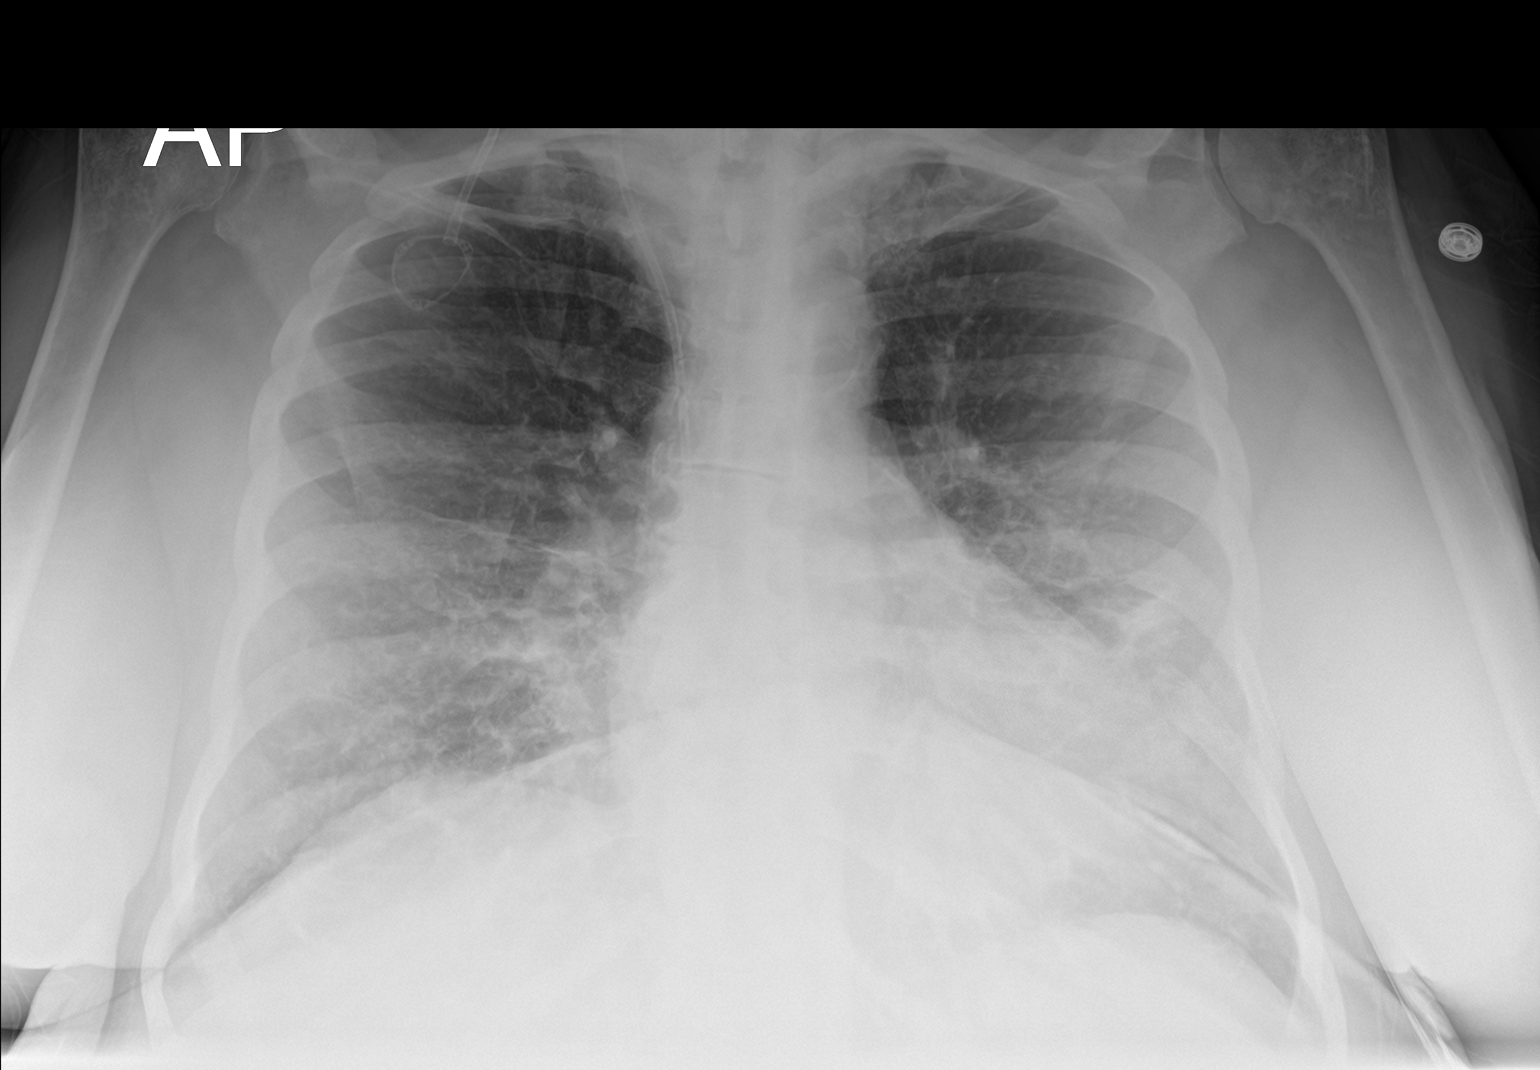

[2 of 2 positions shown; findings below may reference images not displayed]

FINDINGS: Cardiac shadows within normal limits. Mild interstitial changes are
noted bilaterally. No focal infiltrate or sizable effusion is seen.
Mottled sclerosis is noted over the bony structures of the spine as
well as in the proximal left humerus which may be related to bony
metastatic disease. Clinical correlation is recommended.
IMPRESSION: Mild interstitial changes without focal infiltrate.

Changes suggestive of bony metastatic disease. Clinical correlation
with the given history is recommended.

## 2017-01-21 IMAGING — CT CT CHEST W/ CM
2 of 4 series · 12 of 36 positions shown, 15 images · IV contrast (APPLIED)
Comparison: No priors.

CLINICAL DATA: 69-year-old female with history of breast cancer
diagnosed 3 years ago. Shortness of breath. Abdominal pain today.
Nausea and vomiting.

EXAM:
CT CHEST, ABDOMEN, AND PELVIS WITH CONTRAST
TECHNIQUE: Multidetector CT imaging of the chest, abdomen and pelvis was
performed following the standard protocol during bolus
administration of intravenous contrast.
CONTRAST:  100 mL of Omnipaque 300.

[Series 2: cap with 2 · axial · 0.91mm/px · z∈[-591,-56]mm · 9 of 121 slices shown, 12 images]
[im 7/121  mediastinal]
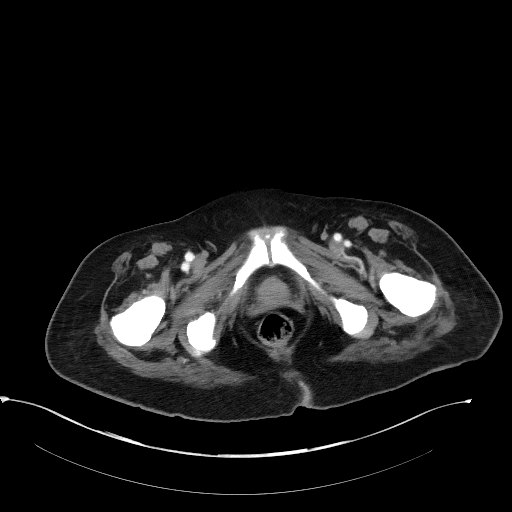
[im 7/121  lung]
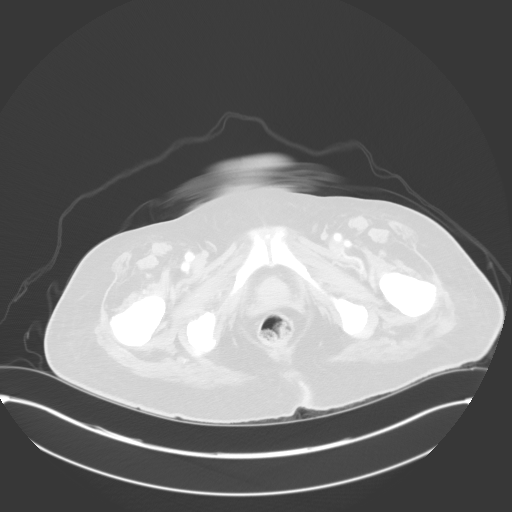
[im 21/121  lung]
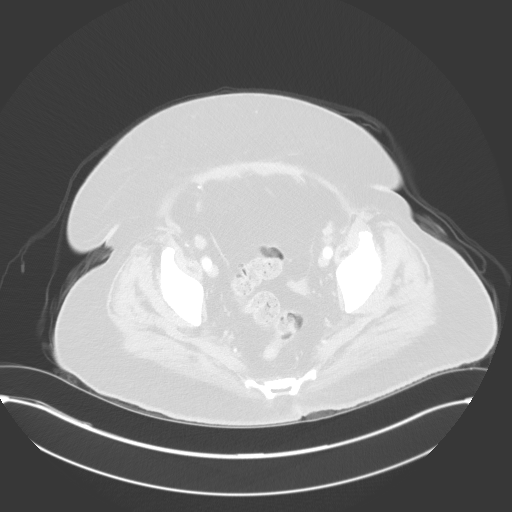
[im 34/121  lung]
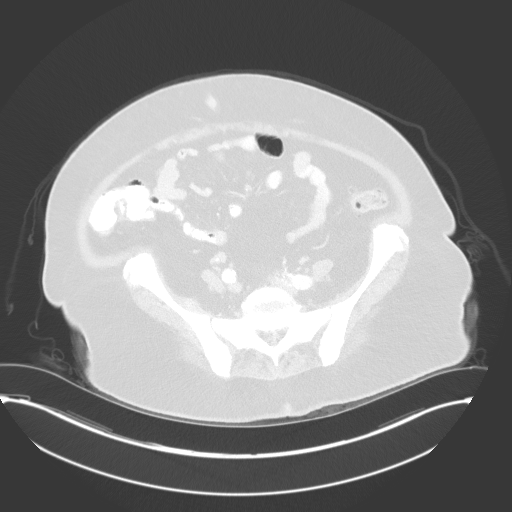
[im 47/121  lung]
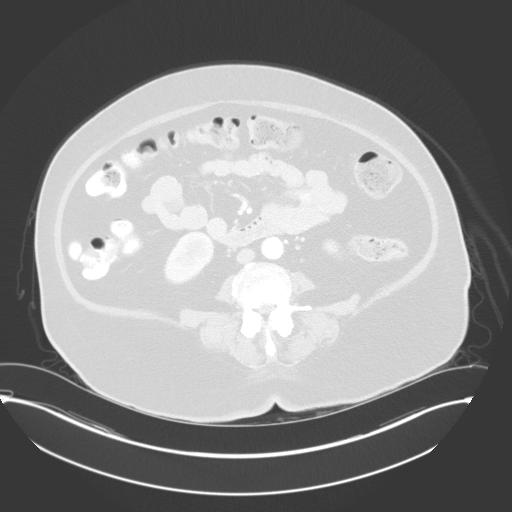
[im 61/121  mediastinal]
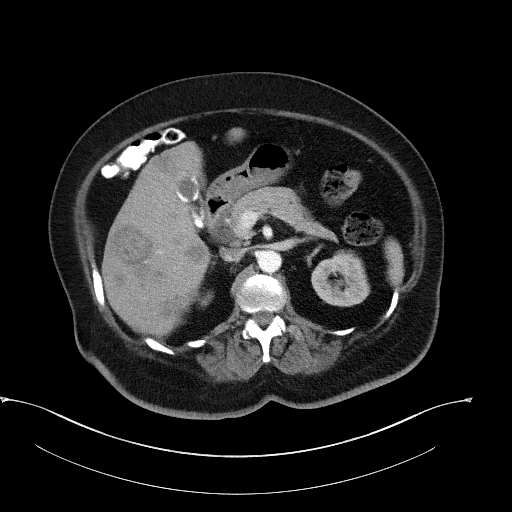
[im 61/121  lung]
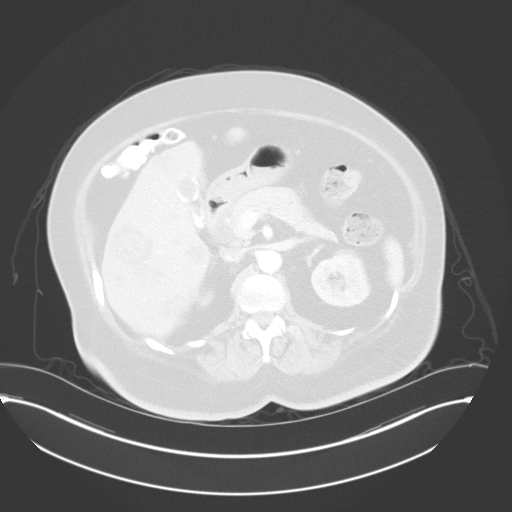
[im 74/121  lung]
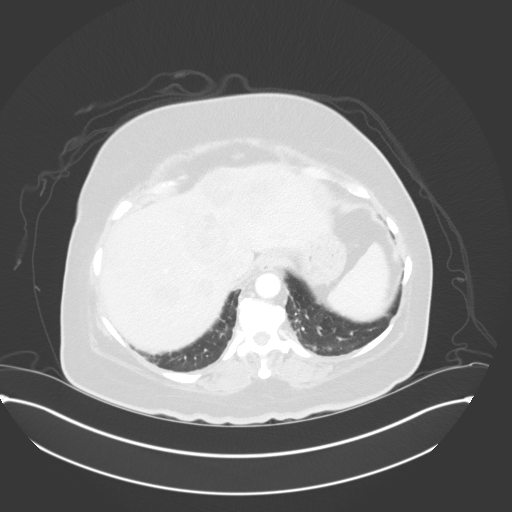
[im 87/121  lung]
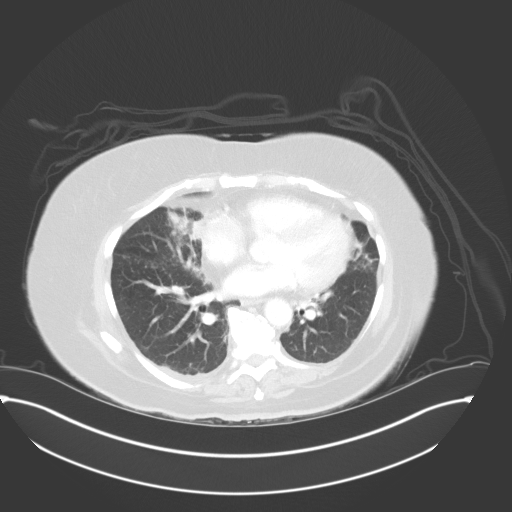
[im 101/121  lung]
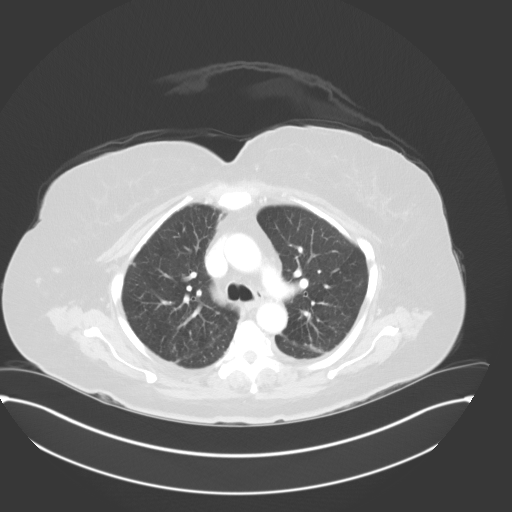
[im 114/121  mediastinal]
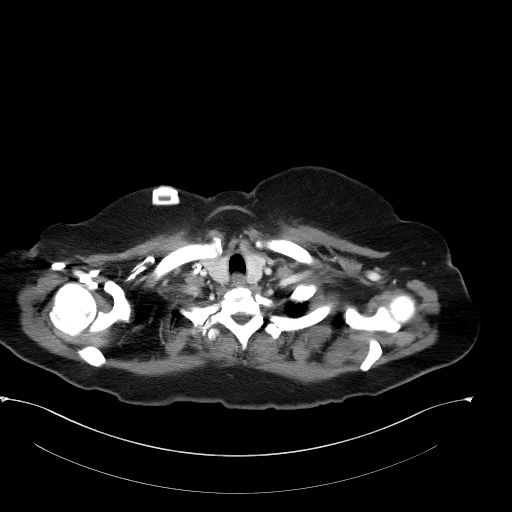
[im 114/121  lung]
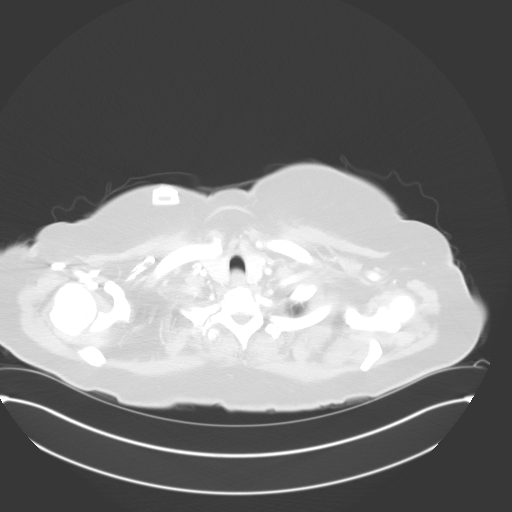

[Series 5: coronals · coronal · 0.91mm/px · 3 of 146 slices shown]
[im 30/146  lung]
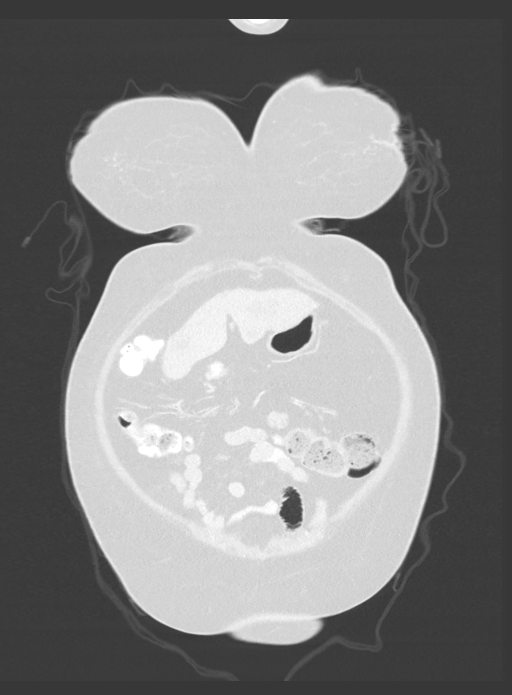
[im 59/146  lung]
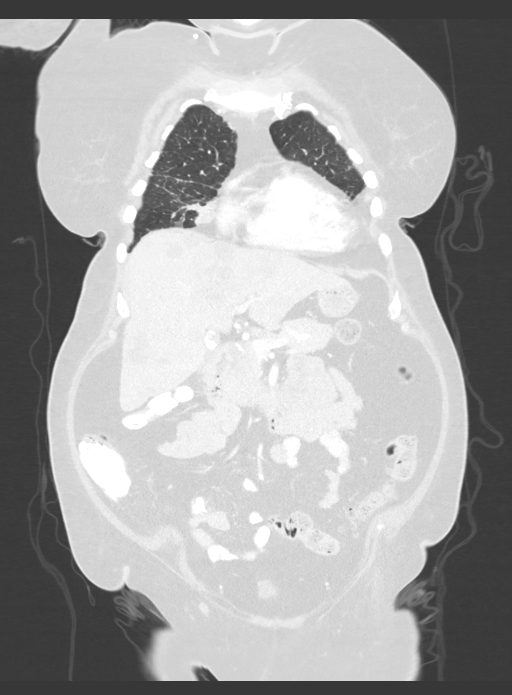
[im 88/146  lung]
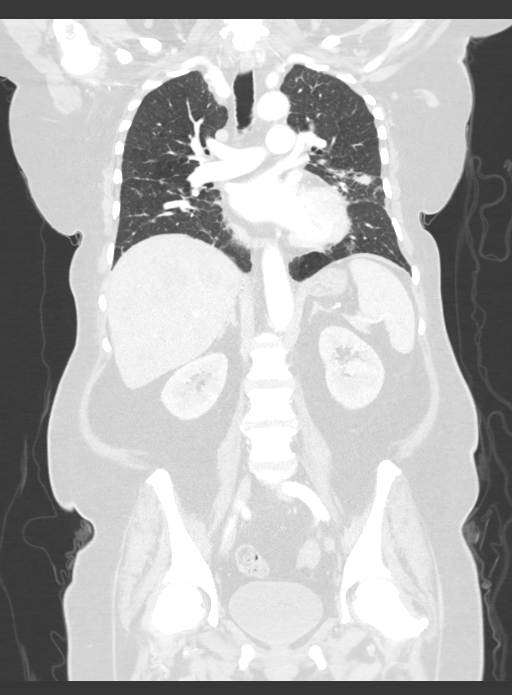

[12 of 36 positions shown; findings below may reference images not displayed]

FINDINGS: CT CHEST FINDINGS

Mediastinum/Lymph Nodes: Heart size is normal. There is no
significant pericardial fluid, thickening or pericardial
calcification. No pathologically enlarged mediastinal, hilar or
internal mammary lymph nodes. Esophagus is unremarkable in
appearance. Mildly enlarged left axillary lymph node measuring up to
1 cm in short axis. No right axillary lymphadenopathy. Right-sided
internal jugular single-lumen porta cath with tip terminating in the
distal superior vena cava.

Lungs/Pleura: Innumerable tiny pulmonary nodules, most measuring 2-3
mm in size, scattered throughout the lungs bilaterally. These appear
to follow a definitive craniocaudal gradient, and although many
appear to be random, others appear to be predominantly subpleural in
location, particularly associated with the right major fissure. In
addition, there is an area of apparent consolidation and
architectural distortion in the left upper lobe (image 30 of series
4). Mild nodular thickening of the pleura and trace right pleural
effusion layering dependently.

Musculoskeletal/Soft Tissues: Diffuse predominantly sclerotic
lesions noted throughout all aspects of the visualized axial and
appendicular skeleton, compatible with widespread metastatic disease
to the bones. This is particularly notable in the sternum and
manubrium where there is extensive surrounding soft tissue
thickening (the anterior more than posterior) resulting in a soft
tissue mass which measures approximately 4.6 x 6.7 cm at the level
of the manubrium (image 15 of series 2).

CT ABDOMEN AND PELVIS FINDINGS

Hepatobiliary: Numerous hypovascular heterogeneously enhancing
hepatic lesions are compatible with widespread metastatic disease to
the liver. The largest of these include a lesion in the central
aspect of the liver involving predominantly segment 4A (image 47 of
series 2) measuring 5.1 x 4.3 cm, a large lesion in the superior
aspect of segment 2 (image 45 of series 2) measuring approximately
4.8 x 6.0 cm, and a lesion in segment 5 of the liver (image 62 of
series 2) measuring approximately 4.2 x 3.6 cm. Multiple smaller
lesions are also noted. No intra or extrahepatic biliary ductal
dilatation. Multiple rim calcified gallstones are noted in the
gallbladder, largest of which measures 3.4 x 2.3 cm in the
gallbladder fundus. The gallbladder appears completely contracted
around the stones. No surrounding inflammatory changes to suggest an
acute cholecystitis at this time.

Pancreas: No pancreatic mass. No pancreatic ductal dilatation. No
pancreatic or peripancreatic fluid or inflammatory changes.

Spleen: Unremarkable.

Adrenals/Urinary Tract: Bilateral adrenal glands are normal in
appearance. Two well-defined low-attenuation nonenhancing lesions
are noted in the interpolar region of the right kidney measuring up
to 2.5 cm in diameter, compatible with simple cysts. Left kidney is
normal in appearance. No hydroureteronephrosis. Urinary bladder is
normal in appearance.

Stomach/Bowel: Normal appearance of the stomach. No pathologic
dilatation of small bowel or colon. Numerous colonic diverticulae
are noted, most evident in the sigmoid colon, without surrounding
inflammatory changes to suggest an acute diverticulitis at this
time. Normal appendix.

Vascular/Lymphatic: Atherosclerosis throughout the abdominal and
pelvic vasculature, without evidence of aneurysm or dissection.
Prominent external iliac lymph nodes bilaterally measuring up to 11
mm in short axis. No other definite lymphadenopathy in the abdomen
or pelvis.

Reproductive: Status post hysterectomy.  Ovaries are atrophic.

Other: No significant volume of ascites.  No pneumoperitoneum.

Musculoskeletal: Widespread predominantly sclerotic lesions noted
throughout all aspects of the visualized axial and appendicular
skeleton, largest of which is centered in the anterior aspect of the
L3 vertebral body measuring up to 2.8 cm in diameter.
IMPRESSION: 1. Widespread metastatic disease to the liver and lungs, as detailed
above.
2. Mildly enlarged left axillary lymph node and bilateral external
iliac lymph nodes. Metastatic nodal disease is not excluded.
3. Innumerable small pulmonary nodules scattered throughout the
lungs bilaterally. Given that most of these appear to be randomly
distributed and appear to follow a craniocaudal gradient (as would
be expected with blood [place] dissemination), metastatic disease to
the lungs is suspected. Given the extensive pleural nodularity and
thickening, some mild pleural involvement and/or developing
lymphangitic spread of tumor is also suspected.
4. Apparent area of consolidation in the left upper lobe could be
infectious, but could represent a more extensive area of tumoral
infiltration. Attention on followup studies is recommended.
5. Trace right-sided pleural effusion, likely malignant.
6. Cholelithiasis without evidence of acute cholecystitis at this
time.
7. Mild colonic diverticulosis without evidence of acute
diverticulitis at this time.
8. Atherosclerosis.
9. Additional incidental findings, as above.
